# Patient Record
Sex: Female | Born: 1955 | Race: Black or African American | Hispanic: No | Marital: Single | Smoking: Never smoker
Health system: Southern US, Community
[De-identification: ages and names within clinical notes are randomized; demographics above are authoritative.]

## PROBLEM LIST (undated history)

## (undated) DIAGNOSIS — I10 Essential (primary) hypertension: Secondary | ICD-10-CM

## (undated) DIAGNOSIS — E782 Mixed hyperlipidemia: Secondary | ICD-10-CM

## (undated) DIAGNOSIS — B20 Human immunodeficiency virus [HIV] disease: Secondary | ICD-10-CM

## (undated) DIAGNOSIS — E119 Type 2 diabetes mellitus without complications: Secondary | ICD-10-CM

## (undated) HISTORY — DX: Human immunodeficiency virus (HIV) disease: B20

## (undated) HISTORY — PX: HAMMER TOE SURGERY: SHX385

---

## 2004-01-24 HISTORY — PX: GASTRIC BYPASS OPEN: SUR638

## 2007-01-24 HISTORY — PX: TOTAL KNEE ARTHROPLASTY: SHX125

## 2015-12-03 ENCOUNTER — Telehealth: Payer: Self-pay

## 2015-12-03 DIAGNOSIS — B20 Human immunodeficiency virus [HIV] disease: Secondary | ICD-10-CM

## 2015-12-03 HISTORY — DX: Human immunodeficiency virus (HIV) disease: B20

## 2015-12-03 NOTE — Telephone Encounter (Signed)
Patient calling to see if medical records received . She will be out of medication in one week.  She will also need financial assistance and once approved she can complete labs.  Patient is very compliant. Last labs viral load suppressed.   Samples of Atripla available.   Laurell Josephsammy K Jacayla Nordell, RN

## 2015-12-07 ENCOUNTER — Other Ambulatory Visit: Payer: Medicare HMO

## 2015-12-07 ENCOUNTER — Ambulatory Visit: Payer: Medicare HMO

## 2015-12-07 ENCOUNTER — Other Ambulatory Visit (HOSPITAL_COMMUNITY)
Admission: RE | Admit: 2015-12-07 | Discharge: 2015-12-07 | Disposition: A | Discharge status: Home / Self Care | Payer: Medicare HMO | Source: Ambulatory Visit | Attending: Internal Medicine | Admitting: Internal Medicine

## 2015-12-07 DIAGNOSIS — Z113 Encounter for screening for infections with a predominantly sexual mode of transmission: Secondary | ICD-10-CM | POA: Insufficient documentation

## 2015-12-07 DIAGNOSIS — Z0000 Encounter for general adult medical examination without abnormal findings: Secondary | ICD-10-CM

## 2015-12-07 DIAGNOSIS — B20 Human immunodeficiency virus [HIV] disease: Secondary | ICD-10-CM

## 2015-12-07 DIAGNOSIS — Z79899 Other long term (current) drug therapy: Secondary | ICD-10-CM

## 2015-12-07 LAB — COMPLETE METABOLIC PANEL WITH GFR
ALBUMIN: 3.9 g/dL (ref 3.6–5.1)
ALT: 18 U/L (ref 6–29)
AST: 27 U/L (ref 10–35)
Alkaline Phosphatase: 135 U/L — ABNORMAL HIGH (ref 33–130)
BUN: 16 mg/dL (ref 7–25)
CALCIUM: 9.3 mg/dL (ref 8.6–10.4)
CHLORIDE: 111 mmol/L — AB (ref 98–110)
CO2: 21 mmol/L (ref 20–31)
CREATININE: 0.82 mg/dL (ref 0.50–0.99)
GFR, Est African American: 89 mL/min (ref >=60)
GFR, Est Non African American: 77 mL/min (ref >=60)
GLUCOSE: 85 mg/dL (ref 65–99)
Potassium: 4.8 mmol/L (ref 3.5–5.3)
Sodium: 141 mmol/L (ref 135–146)
Total Bilirubin: 0.4 mg/dL (ref 0.2–1.2)
Total Protein: 7 g/dL (ref 6.1–8.1)

## 2015-12-07 LAB — LIPID PANEL
CHOL/HDL RATIO: 1.9 ratio (ref <5.0)
CHOLESTEROL: 169 mg/dL (ref <200)
HDL: 89 mg/dL (ref >50)
LDL CALC: 68 mg/dL (ref <100)
TRIGLYCERIDES: 59 mg/dL (ref <150)
VLDL: 12 mg/dL (ref <30)

## 2015-12-07 LAB — CBC WITH DIFFERENTIAL/PLATELET
BASOS PCT: 0 %
Basophils Absolute: 0 cells/uL (ref 0–200)
Eosinophils Absolute: 43 cells/uL (ref 15–500)
Eosinophils Relative: 1 %
HEMATOCRIT: 37.4 % (ref 35.0–45.0)
HEMOGLOBIN: 12.6 g/dL (ref 11.7–15.5)
LYMPHS ABS: 2322 {cells}/uL (ref 850–3900)
Lymphocytes Relative: 54 %
MCH: 32.4 pg (ref 27.0–33.0)
MCHC: 33.7 g/dL (ref 32.0–36.0)
MCV: 96.1 fL (ref 80.0–100.0)
MONO ABS: 301 {cells}/uL (ref 200–950)
MPV: 9.8 fL (ref 7.5–12.5)
Monocytes Relative: 7 %
Neutro Abs: 1634 cells/uL (ref 1500–7800)
Neutrophils Relative %: 38 %
Platelets: 296 10*3/uL (ref 140–400)
RBC: 3.89 MIL/uL (ref 3.80–5.10)
RDW: 13.6 % (ref 11.0–15.0)
WBC: 4.3 10*3/uL (ref 3.8–10.8)

## 2015-12-08 LAB — RPR

## 2015-12-08 LAB — URINALYSIS
BILIRUBIN URINE: NEGATIVE
Glucose, UA: NEGATIVE
Hgb urine dipstick: NEGATIVE
KETONES UR: NEGATIVE
Leukocytes, UA: NEGATIVE
Nitrite: NEGATIVE
PROTEIN: NEGATIVE
Specific Gravity, Urine: 1.013 (ref 1.001–1.035)
pH: 6 (ref 5.0–8.0)

## 2015-12-08 LAB — T-HELPER CELL (CD4) - (RCID CLINIC ONLY)
CD4 % Helper T Cell: 37 % (ref 33–55)
CD4 T Cell Abs: 940 /uL (ref 400–2700)

## 2015-12-08 LAB — URINE CYTOLOGY ANCILLARY ONLY
CHLAMYDIA, DNA PROBE: NEGATIVE
Neisseria Gonorrhea: NEGATIVE

## 2015-12-08 LAB — HEPATITIS A ANTIBODY, TOTAL: Hep A Total Ab: NONREACTIVE

## 2015-12-08 LAB — HEPATITIS C ANTIBODY: HCV Ab: NEGATIVE

## 2015-12-08 LAB — HEPATITIS B CORE ANTIBODY, TOTAL: Hep B Core Total Ab: NONREACTIVE

## 2015-12-08 LAB — HEPATITIS B SURFACE ANTIGEN: Hepatitis B Surface Ag: NEGATIVE

## 2015-12-08 LAB — HEPATITIS B SURFACE ANTIBODY,QUALITATIVE: Hep B S Ab: NEGATIVE

## 2015-12-09 LAB — HIV-1 RNA ULTRAQUANT REFLEX TO GENTYP+

## 2015-12-10 LAB — QUANTIFERON TB GOLD ASSAY (BLOOD)
INTERFERON GAMMA RELEASE ASSAY: NEGATIVE
MITOGEN-NIL SO: 7.1 [IU]/mL
QUANTIFERON NIL VALUE: 0.02 [IU]/mL
QUANTIFERON TB AG MINUS NIL: 0 [IU]/mL

## 2015-12-14 LAB — HLA B*5701: HLA-B 5701 W/RFLX HLA-B HIGH: NEGATIVE

## 2015-12-27 ENCOUNTER — Encounter: Payer: Self-pay | Admitting: Internal Medicine

## 2015-12-27 ENCOUNTER — Ambulatory Visit (INDEPENDENT_AMBULATORY_CARE_PROVIDER_SITE_OTHER): Payer: Medicare HMO | Admitting: Internal Medicine

## 2015-12-27 ENCOUNTER — Other Ambulatory Visit: Payer: Self-pay | Admitting: *Deleted

## 2015-12-27 VITALS — BP 170/94 | HR 61 | Temp 98.0°F | Ht 63.0 in | Wt 202.0 lb

## 2015-12-27 DIAGNOSIS — I10 Essential (primary) hypertension: Secondary | ICD-10-CM | POA: Diagnosis not present

## 2015-12-27 DIAGNOSIS — B20 Human immunodeficiency virus [HIV] disease: Secondary | ICD-10-CM

## 2015-12-27 DIAGNOSIS — Z9889 Other specified postprocedural states: Secondary | ICD-10-CM | POA: Diagnosis not present

## 2015-12-27 DIAGNOSIS — B009 Herpesviral infection, unspecified: Secondary | ICD-10-CM

## 2015-12-27 DIAGNOSIS — Z9884 Bariatric surgery status: Secondary | ICD-10-CM

## 2015-12-27 DIAGNOSIS — Z98891 History of uterine scar from previous surgery: Secondary | ICD-10-CM | POA: Diagnosis not present

## 2015-12-27 MED ORDER — ACYCLOVIR 400 MG PO TABS: 400.0000 mg | ORAL_TABLET | Freq: Two times a day (BID) | ORAL | 2 refills | Status: DC

## 2015-12-27 MED ORDER — CALCIUM CITRATE 950 (200 CA) MG PO TABS: 200.0000 mg | ORAL_TABLET | Freq: Every day | ORAL | 5 refills | Status: DC

## 2015-12-27 MED ORDER — EFAVIRENZ-EMTRICITAB-TENOFOVIR 600-200-300 MG PO TABS: 1.0000 | ORAL_TABLET | Freq: Every day | ORAL | 11 refills | Status: DC

## 2015-12-27 NOTE — Progress Notes (Signed)
Patient ID: Helen James A Nelligan, female    DOB: 06-Oct-1955, 60 y.o.   MRN: [id number]  Reason for visit: to establish care as a new patient with HIV  HPI:   Patient was first diagnosed in the 361980s.  She was tested as part as part of an exposure and has been on medications for many years, she can't recall when she first started.  She has been on Atripla for over 10 years and does not recall what previous medicaitons she was on.  She denies every missing a dose, takes at 2pm daily.  The CD4 count is 940, viral load < 20.  There have been no associated symptoms.    PMH: HIV, htn, DM, history of gastric bypass  Prior to Admission medications   Medication Sig Start Date End Date Taking? Authorizing Provider  efavirenz-emtricitabine-tenofovir (ATRIPLA) 600-200-300 MG tablet Take 1 tablet by mouth at bedtime. 12/27/15  Yes Gardiner Barefootobert W Comer, MD  Multiple Vitamin (MULTIVITAMIN) tablet Take 1 tablet by mouth daily.   Yes Historical Provider, MD  ramipril (ALTACE) 10 MG capsule Take 20 mg by mouth daily.   Yes Historical Provider, MD  ursodiol (ACTIGALL) 250 MG tablet Take 250 mg by mouth 2 (two) times daily.   Yes Historical Provider, MD  acyclovir (ZOVIRAX) 400 MG tablet Take 1 tablet (400 mg total) by mouth 2 (two) times daily. 12/27/15   Gardiner Barefootobert W Comer, MD    Allergies  Allergen Reactions  . Aspirin Tinitus  . Feldene [Piroxicam] Other (See Comments)    States causes eyelids to "jump"  . Latex Itching  . Percocet [Oxycodone-Acetaminophen]     Hallucinations per patient    Social History  Substance Use Topics  . Smoking status: Never Smoker  . Smokeless tobacco: Never Used  . Alcohol use No    FMHx: no renal disease  Review of Systems Constitutional: negative for fatigue and malaise Cardiovascular: negative for chest pressure/discomfort Gastrointestinal: negative for diarrhea Musculoskeletal: negative for myalgias and arthralgias All other systems reviewed and are negative     CONSTITUTIONAL:in no apparent distress and alert  Vitals:   12/27/15 0840  BP: (!) 170/94  Pulse: 61  Temp: 98 F (36.7 C)   EYES: anicteric HENT: no thrush CARD:Cor RRR RESP:CTA B; normal respiratory effort NW:GNFAOGI:Bowel sounds are normal, liver is not enlarged, spleen is not enlarged MS:no pedal edema noted SKIN:no rashes NEURO: non-focal  Lab Results  Component Value Date   HIV1RNAQUANT <20 12/07/2015   No components found for: HIV1GENOTYPRPLUS No components found for: THELPERCELL  Assessment: new patient here with established HIV.  Discussed with patient treatment options and side effects, benefits of treatment, long term outcomes.  I discussed the severity of untreated HIV including higher cancer risk, opportunistic infections, renal failure.  Also discussed needing to use condoms, partner disclosure, necessary vaccines, blood monitoring.  All questions answered.  She is happy with Atripla  Plan: 1) rtc 3 months with labs 2) Atripla given as 30 day supply 3) SPEP applied for and waiting for approval - she was advised to call prior to running out to be sure it is available 4) I discussed other treatment alternatives with TAF and she is considering, though hesistant to change 5) advised to take her BP medication which she missed this am

## 2015-12-27 NOTE — Addendum Note (Signed)
Addended by: Gardiner BarefootOMER, Adasha Boehme W on: 12/27/2015 12:03 PM   Modules accepted: Orders

## 2015-12-28 ENCOUNTER — Encounter: Payer: Self-pay | Admitting: *Deleted

## 2016-02-16 ENCOUNTER — Other Ambulatory Visit: Payer: Self-pay | Admitting: *Deleted

## 2016-02-16 ENCOUNTER — Telehealth: Payer: Self-pay | Admitting: *Deleted

## 2016-02-16 DIAGNOSIS — B20 Human immunodeficiency virus [HIV] disease: Secondary | ICD-10-CM

## 2016-02-16 MED ORDER — EFAVIRENZ-EMTRICITAB-TENOFOVIR 600-200-300 MG PO TABS: 1.0000 | ORAL_TABLET | Freq: Every day | ORAL | 11 refills | Status: DC

## 2016-02-16 NOTE — Telephone Encounter (Signed)
Patient has been approved for SPAP, still waiting for pharmacy card. Olegario MessierKathy [place] contact [place] to [place].  In mean time, patient will be out of Atripla next week.  She will come Tuesday to pick up a sample from the office. Andree CossHowell, Grey Schlauch M, RN

## 2016-02-22 ENCOUNTER — Ambulatory Visit: Payer: Medicare HMO

## 2016-03-14 ENCOUNTER — Encounter: Payer: Self-pay | Admitting: Internal Medicine

## 2016-03-28 ENCOUNTER — Other Ambulatory Visit: Payer: Medicare HMO

## 2016-03-28 DIAGNOSIS — B20 Human immunodeficiency virus [HIV] disease: Secondary | ICD-10-CM

## 2016-03-29 LAB — T-HELPER CELL (CD4) - (RCID CLINIC ONLY)
CD4 % Helper T Cell: 37 % (ref 33–55)
CD4 T Cell Abs: 800 /uL (ref 400–2700)

## 2016-03-30 LAB — HIV-1 RNA QUANT-NO REFLEX-BLD
HIV 1 RNA QUANT: NOT DETECTED {copies}/mL
HIV-1 RNA QUANT, LOG: NOT DETECTED {Log_copies}/mL

## 2016-04-11 ENCOUNTER — Ambulatory Visit (INDEPENDENT_AMBULATORY_CARE_PROVIDER_SITE_OTHER): Payer: Medicare HMO | Admitting: Internal Medicine

## 2016-04-11 ENCOUNTER — Encounter: Payer: Self-pay | Admitting: Internal Medicine

## 2016-04-11 DIAGNOSIS — Z79899 Other long term (current) drug therapy: Secondary | ICD-10-CM

## 2016-04-11 DIAGNOSIS — Z113 Encounter for screening for infections with a predominantly sexual mode of transmission: Secondary | ICD-10-CM | POA: Diagnosis not present

## 2016-04-11 DIAGNOSIS — B20 Human immunodeficiency virus [HIV] disease: Secondary | ICD-10-CM

## 2016-04-11 DIAGNOSIS — B009 Herpesviral infection, unspecified: Secondary | ICD-10-CM

## 2016-04-11 NOTE — Progress Notes (Signed)
CC: Follow up for HIV  Interval history: Currently is asymptomatic and well-controlled on Atripla.  CD4 is 800 and virus is < 20.  Since last visit no new issues.  Has no associated n/v/d.  Denies any missed doses.     Prior to Admission medications   Medication Sig Start Date End Date Taking? Authorizing Provider  acyclovir (ZOVIRAX) 400 MG tablet Take 1 tablet (400 mg total) by mouth 2 (two) times daily. 12/27/15  Yes Gardiner Barefootobert W Oslo Huntsman, MD  Biotin 300 MCG TABS Take by mouth.   Yes Historical Provider, MD  calcium citrate (CALCITRATE - DOSED IN MG ELEMENTAL CALCIUM) 950 MG tablet Take 1 tablet (200 mg of elemental calcium total) by mouth daily. 12/27/15  Yes Gardiner Barefootobert W Sire Poet, MD  efavirenz-emtricitabine-tenofovir (ATRIPLA) 600-200-300 MG tablet Take 1 tablet by mouth at bedtime. 02/16/16  Yes Gardiner Barefootobert W Sherrel Ploch, MD  Multiple Vitamin (MULTIVITAMIN) tablet Take 1 tablet by mouth daily.   Yes Historical Provider, MD  ramipril (ALTACE) 10 MG capsule Take 20 mg by mouth daily.   Yes Historical Provider, MD  ursodiol (ACTIGALL) 250 MG tablet Take 250 mg by mouth 2 (two) times daily.   Yes Historical Provider, MD    Review of Systems Gastrointestinal: negative for diarrhea Musculoskeletal: negative for myalgias and arthralgias All other systems reviewed and are negative    Physical Exam: CONSTITUTIONAL:in no apparent distress  Vitals:   04/11/16 0954  BP: (!) 157/87  Pulse: 62  Temp: 98 F (36.7 C)   Eyes: anicteric HENT: no thrush, no cervical lymphadenopathy Respiratory: Normal respiratory effort; CTA B Cardiovascular: RRR  Lab Results  Component Value Date   HIV1RNAQUANT <20 NOT DETECTED 03/28/2016   HIV1RNAQUANT <20 12/07/2015   No components found for: HIV1GENOTYPRPLUS No components found for: THELPERCELL

## 2016-04-11 NOTE — Assessment & Plan Note (Signed)
Continues on suppressive therapy

## 2016-04-11 NOTE — Assessment & Plan Note (Signed)
Doing well on current regimen and is not interested in changing to a TAF-containing regimen.  rtc 6 months.

## 2016-05-07 ENCOUNTER — Encounter (HOSPITAL_COMMUNITY): Payer: Self-pay | Admitting: Emergency Medicine

## 2016-05-07 ENCOUNTER — Emergency Department (HOSPITAL_COMMUNITY)
Admission: EM | Admit: 2016-05-07 | Discharge: 2016-05-07 | Disposition: A | Discharge status: Home / Self Care | Payer: Medicare HMO | Attending: Emergency Medicine | Admitting: Emergency Medicine

## 2016-05-07 DIAGNOSIS — Z79899 Other long term (current) drug therapy: Secondary | ICD-10-CM | POA: Diagnosis not present

## 2016-05-07 DIAGNOSIS — T783XXA Angioneurotic edema, initial encounter: Secondary | ICD-10-CM | POA: Diagnosis not present

## 2016-05-07 DIAGNOSIS — Z91040 Latex allergy status: Secondary | ICD-10-CM | POA: Diagnosis not present

## 2016-05-07 DIAGNOSIS — I10 Essential (primary) hypertension: Secondary | ICD-10-CM | POA: Insufficient documentation

## 2016-05-07 DIAGNOSIS — T7840XA Allergy, unspecified, initial encounter: Secondary | ICD-10-CM | POA: Diagnosis present

## 2016-05-07 MED ORDER — FAMOTIDINE 20 MG PO TABS: 20.0000 mg | ORAL_TABLET | Freq: Two times a day (BID) | ORAL | 0 refills | Status: DC

## 2016-05-07 MED ORDER — PREDNISONE 20 MG PO TABS
60.0000 mg | ORAL_TABLET | ORAL | Status: AC
  Administered 2016-05-07: 60 mg via ORAL
  Filled 2016-05-07: qty 3

## 2016-05-07 MED ORDER — PREDNISONE 20 MG PO TABS: 40.0000 mg | ORAL_TABLET | Freq: Every day | ORAL | 0 refills | Status: DC

## 2016-05-07 MED ORDER — DIPHENHYDRAMINE HCL 25 MG PO CAPS
25.0000 mg | ORAL_CAPSULE | Freq: Once | ORAL | Status: AC
  Administered 2016-05-07: 25 mg via ORAL
  Filled 2016-05-07: qty 1

## 2016-05-07 MED ORDER — DIPHENHYDRAMINE HCL 25 MG PO TABS: 25.0000 mg | ORAL_TABLET | Freq: Three times a day (TID) | ORAL | 0 refills | Status: AC

## 2016-05-07 MED ORDER — FAMOTIDINE 20 MG PO TABS
20.0000 mg | ORAL_TABLET | Freq: Once | ORAL | Status: AC
  Administered 2016-05-07: 20 mg via ORAL
  Filled 2016-05-07: qty 1

## 2016-05-07 NOTE — ED Triage Notes (Signed)
Pt. Stated, My lips are swollen since yesterday, no problem swallowing, but I can feel it a little bit. I did eat some shrim,p on Friday and this happened before we thought it was the shrimp.

## 2016-05-07 NOTE — ED Provider Notes (Signed)
[facility]EMER[facility]ENCY DEPT Provider Note   CSN: [id number] Arrival date & time: 05/07/16  0919     History   Chief Complaint  Facial swelling, possible allergic reaction  HPI Helen James is a 61 y.o. female.  HPI  Patient presents with one day of lip swelling. She is here with multiple family members who assist history of present illness, corroborate story. Patient notes that she was well until yesterday, when after eating a meal of seafood she began to have lip swelling. She has had similar episodes in the past, typically beginning with swelling about the right lateral lip, but this episode has been longer lasting, and it has progressed to include both lips, and there are entirety. She describes some sensation of fullness, both in the lips, and throat, but notes dyspnea, no difficult swallowing, speaking, no lightheadedness, no other complaints.  Since onset she is taking Benadryl, without appreciable change in her condition.  History reviewed. No pertinent past medical history.  Patient Active Problem List   Diagnosis Date Noted  . Screening examination for venereal disease 04/11/2016  . Encounter for long-term (current) use of high-risk medication 04/11/2016  . H/O gastric bypass 12/27/2015  . H/O cesarean section 12/27/2015  . HTN (hypertension) 12/27/2015  . 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 HIV disease (HCC) 12/03/2015    History reviewed. No pertinent surgical history.  OB History    No data available       Home Medications    Prior to Admission medications   Medication Sig Start Date End Date Taking? Authorizing Provider  acyclovir (ZOVIRAX) 400 MG tablet Take 1 tablet (400 mg total) by mouth 2 (two) times daily. 12/27/15   Gardiner Barefoot, MD  Biotin 300 MCG TABS Take by mouth.    Historical Provider, MD  calcium citrate (CALCITRATE - DOSED IN MG ELEMENTAL CALCIUM) 950 MG tablet Take 1 tablet (200 mg of elemental calcium total) by mouth daily. 12/27/15   Gardiner Barefoot, MD  efavirenz-emtricitabine-tenofovir (ATRIPLA) 600-200-300 MG tablet Take 1 tablet by mouth at bedtime. 02/16/16   Gardiner Barefoot, MD  Multiple Vitamin (MULTIVITAMIN) tablet Take 1 tablet by mouth daily.    Historical Provider, MD  ramipril (ALTACE) 10 MG capsule Take 20 mg by mouth daily.    Historical Provider, MD  ursodiol (ACTIGALL) 250 MG tablet Take 250 mg by mouth 2 (two) times daily.    Historical Provider, MD    Family History No family history on file.  Social History Social History  Substance Use Topics  . Smoking status: Never Smoker  . Smokeless tobacco: Never Used  . Alcohol use No     Allergies   Aspirin; Feldene [piroxicam]; Latex; and Percocet [oxycodone-acetaminophen]   Review of Systems Review of Systems  Constitutional:       Per HPI, otherwise negative  HENT:       Per HPI, otherwise negative  Respiratory:       Per HPI, otherwise negative  Cardiovascular:       Per HPI, otherwise negative  Gastrointestinal: Negative for vomiting.  Endocrine:       Negative aside from HPI  Genitourinary:       Neg aside from HPI   Musculoskeletal:       Per HPI, otherwise negative  Skin: Negative.   Allergic/Immunologic: Negative for immunocompromised state.  Neurological: Negative for syncope.     Physical Exam Updated Vital Signs BP (!) 142/79 (BP Location: Left Arm)   Pulse 61  Temp 98.7 F (37.1 C)   Resp 17   Ht  (1.549 m)   Wt 207 lb (93.9 kg)   SpO2 100%   BMI 39.11 kg/m   Physical Exam  Constitutional: She is oriented to person, place, and time. She appears well-developed and well-nourished. No distress.  HENT:  Head: Normocephalic and atraumatic.  Obvious angioedema of both lips, with fullness, no asymmetry. Posterior oropharynx is clear, with no appreciable swelling, no asymmetry, no uvula edema.  Eyes: Conjunctivae and EOM are normal.  Cardiovascular: Normal rate and regular rhythm.   Pulmonary/Chest: Effort normal and  breath sounds normal. No stridor. No respiratory distress.  Abdominal: She exhibits no distension.  Musculoskeletal: She exhibits no edema.  Neurological: She is alert and oriented to person, place, and time. No cranial nerve deficit.  Skin: Skin is warm and dry.  Psychiatric: She has a normal mood and affect.  Nursing note and vitals reviewed.    ED Treatments / Results   Procedures Procedures (including critical care time)  Medications Ordered in ED Medications  predniSONE (DELTASONE) tablet 60 mg (60 mg Oral Given 05/07/16 1000)  diphenhydrAMINE (BENADRYL) capsule 25 mg (25 mg Oral Given 05/07/16 1000)  famotidine (PEPCID) tablet 20 mg (20 mg Oral Given 05/07/16 1000)      11:33 AM Lower lip nearly returned to normal, upper lip still slightly swollen. Patient tolerating oral intake, eating lunch. Initial Impression / Assessment and Plan / ED Course  I have reviewed the triage vital signs and the nursing notes.  Pertinent labs & imaging results that were available during my care of the patient were reviewed by me and considered in my medical decision making (see chart for details).  Now after period of monitoring the patient has no ongoing new complaints, appears similar, with no evidence of respiratory compromise, posterior oral pharyngeal lesion.  With description of a possible precipitant with seafood, patient was advised to minimize that food, but also with what seems to be injured edema related to ACE inhibitor usage, patient will stop that medication.  Patient will follow-up with primary care to consider new antihypertensive, without appreciable hypertension here, patient is safe to do this as an outpatient.      Final Clinical Impressions(s) / ED Diagnoses  Angioedema   Gerhard Munch, MD 05/07/16 1133

## 2016-05-07 NOTE — Discharge Instructions (Signed)
As discussed, it is important that you follow up as soon as possible with your physician for continued management of your condition (hypertension).  If you develop any new, or concerning changes in your condition, please return to the emergency department immediately.

## 2016-08-09 ENCOUNTER — Ambulatory Visit: Payer: Medicare HMO

## 2016-08-10 ENCOUNTER — Encounter: Payer: Self-pay | Admitting: Internal Medicine

## 2016-08-24 ENCOUNTER — Other Ambulatory Visit: Payer: Self-pay | Admitting: Internal Medicine

## 2016-09-19 ENCOUNTER — Other Ambulatory Visit: Payer: Medicare HMO

## 2016-09-19 DIAGNOSIS — B20 Human immunodeficiency virus [HIV] disease: Secondary | ICD-10-CM

## 2016-09-19 LAB — CBC WITH DIFFERENTIAL/PLATELET
BASOS PCT: 0 %
Basophils Absolute: 0 cells/uL (ref 0–200)
EOS ABS: 72 {cells}/uL (ref 15–500)
Eosinophils Relative: 2 %
HEMATOCRIT: 34.8 % — AB (ref 35.0–45.0)
Hemoglobin: 11.8 g/dL (ref 11.7–15.5)
Lymphocytes Relative: 54 %
Lymphs Abs: 1944 cells/uL (ref 850–3900)
MCH: 33.3 pg — ABNORMAL HIGH (ref 27.0–33.0)
MCHC: 33.9 g/dL (ref 32.0–36.0)
MCV: 98.3 fL (ref 80.0–100.0)
MONO ABS: 396 {cells}/uL (ref 200–950)
MONOS PCT: 11 %
MPV: 9.6 fL (ref 7.5–12.5)
NEUTROS ABS: 1188 {cells}/uL — AB (ref 1500–7800)
Neutrophils Relative %: 33 %
PLATELETS: 223 10*3/uL (ref 140–400)
RBC: 3.54 MIL/uL — AB (ref 3.80–5.10)
RDW: 13.3 % (ref 11.0–15.0)
WBC: 3.6 10*3/uL — AB (ref 3.8–10.8)

## 2016-09-19 LAB — COMPREHENSIVE METABOLIC PANEL
ALBUMIN: 3.7 g/dL (ref 3.6–5.1)
ALT: 16 U/L (ref 6–29)
AST: 20 U/L (ref 10–35)
Alkaline Phosphatase: 160 U/L — ABNORMAL HIGH (ref 33–130)
BILIRUBIN TOTAL: 0.3 mg/dL (ref 0.2–1.2)
BUN: 19 mg/dL (ref 7–25)
CO2: 23 mmol/L (ref 20–32)
CREATININE: 0.89 mg/dL (ref 0.50–0.99)
Calcium: 8.8 mg/dL (ref 8.6–10.4)
Chloride: 110 mmol/L (ref 98–110)
Glucose, Bld: 83 mg/dL (ref 65–99)
Potassium: 4.2 mmol/L (ref 3.5–5.3)
SODIUM: 141 mmol/L (ref 135–146)
TOTAL PROTEIN: 6.2 g/dL (ref 6.1–8.1)

## 2016-09-19 LAB — LIPID PANEL
CHOLESTEROL: 175 mg/dL (ref <200)
HDL: 85 mg/dL (ref >50)
LDL Cholesterol: 78 mg/dL (ref <100)
Total CHOL/HDL Ratio: 2.1 Ratio (ref <5.0)
Triglycerides: 61 mg/dL (ref <150)
VLDL: 12 mg/dL (ref <30)

## 2016-09-20 LAB — T-HELPER CELL (CD4) - (RCID CLINIC ONLY)
CD4 % Helper T Cell: 37 % (ref 33–55)
CD4 T Cell Abs: 730 /uL (ref 400–2700)

## 2016-09-20 LAB — RPR

## 2016-09-21 LAB — HIV-1 RNA QUANT-NO REFLEX-BLD
HIV 1 RNA Quant: <20 copies/mL
HIV-1 RNA Quant, Log: <1.3 Log copies/mL

## 2016-10-03 ENCOUNTER — Ambulatory Visit: Payer: Medicare HMO | Admitting: Internal Medicine

## 2016-10-17 ENCOUNTER — Ambulatory Visit (INDEPENDENT_AMBULATORY_CARE_PROVIDER_SITE_OTHER): Payer: Medicare HMO | Admitting: Internal Medicine

## 2016-10-17 ENCOUNTER — Encounter: Payer: Self-pay | Admitting: Internal Medicine

## 2016-10-17 VITALS — BP 164/82 | HR 61 | Temp 98.1°F | Ht 63.0 in | Wt 212.8 lb

## 2016-10-17 DIAGNOSIS — Z79899 Other long term (current) drug therapy: Secondary | ICD-10-CM | POA: Diagnosis not present

## 2016-10-17 DIAGNOSIS — Z113 Encounter for screening for infections with a predominantly sexual mode of transmission: Secondary | ICD-10-CM | POA: Diagnosis not present

## 2016-10-17 DIAGNOSIS — B20 Human immunodeficiency virus [HIV] disease: Secondary | ICD-10-CM | POA: Diagnosis not present

## 2016-10-17 DIAGNOSIS — Z23 Encounter for immunization: Secondary | ICD-10-CM | POA: Diagnosis not present

## 2016-10-17 NOTE — Assessment & Plan Note (Signed)
Lipid panel ok.  Also being followed by her PCP so I will defer future lipid panels to her.

## 2016-10-17 NOTE — Assessment & Plan Note (Signed)
Screened negative 

## 2016-10-17 NOTE — Assessment & Plan Note (Signed)
Doing great.  Not interested in changing to a TAF-containing regimen. She understands if creat changes or other concerns, will change then.  rtc 6 months

## 2016-10-17 NOTE — Progress Notes (Signed)
CC: Follow up for HIV  Interval history: Currently is asymptomatic and well-controlled on Atripla.  CD4 is 730 and virus is < 20.  Since last visit no new issues.  Has no associated n/v/d.  Denies any missed doses.  Creat wnl, no abnormalities.    Prior to Admission medications   Medication Sig Start Date End Date Taking? Authorizing Provider  acyclovir (ZOVIRAX) 400 MG tablet Take 1 tablet (400 mg total) by mouth 2 (two) times daily. 12/27/15  Yes Gardiner Barefoot, MD  Biotin 300 MCG TABS Take by mouth.   Yes Historical Provider, MD  calcium citrate (CALCITRATE - DOSED IN MG ELEMENTAL CALCIUM) 950 MG tablet Take 1 tablet (200 mg of elemental calcium total) by mouth daily. 12/27/15  Yes Gardiner Barefoot, MD  efavirenz-emtricitabine-tenofovir (ATRIPLA) 600-200-300 MG tablet Take 1 tablet by mouth at bedtime. 02/16/16  Yes Gardiner Barefoot, MD  Multiple Vitamin (MULTIVITAMIN) tablet Take 1 tablet by mouth daily.   Yes Historical Provider, MD  ramipril (ALTACE) 10 MG capsule Take 20 mg by mouth daily.   Yes Historical Provider, MD  ursodiol (ACTIGALL) 250 MG tablet Take 250 mg by mouth 2 (two) times daily.   Yes Historical Provider, MD    Review of Systems Gastrointestinal: negative for diarrhea Musculoskeletal: negative for myalgias and arthralgias All other systems reviewed and are negative    Physical Exam: CONSTITUTIONAL:in no apparent distress  Vitals:   10/17/16 0922  BP: (!) 164/82  Pulse: 61  Temp: 98.1 F (36.7 C)   Eyes: anicteric HENT: no thrush Respiratory: Normal respiratory effort; CTA B Cardiovascular: RRR  Lab Results  Component Value Date   HIV1RNAQUANT <20 NOT DETECTED 09/19/2016   HIV1RNAQUANT <20 NOT DETECTED 03/28/2016   HIV1RNAQUANT <20 12/07/2015   No components found for: HIV1GENOTYPRPLUS No components found for: THELPERCELL   SH: no tobacco

## 2017-02-27 ENCOUNTER — Other Ambulatory Visit: Payer: Self-pay | Admitting: Internal Medicine

## 2017-02-27 DIAGNOSIS — B20 Human immunodeficiency virus [HIV] disease: Secondary | ICD-10-CM

## 2017-04-03 ENCOUNTER — Other Ambulatory Visit: Payer: Medicare HMO

## 2017-04-03 DIAGNOSIS — B20 Human immunodeficiency virus [HIV] disease: Secondary | ICD-10-CM

## 2017-04-04 LAB — T-HELPER CELL (CD4) - (RCID CLINIC ONLY)
CD4 T CELL ABS: 820 /uL (ref 400–2700)
CD4 T CELL HELPER: 38 % (ref 33–55)

## 2017-04-05 LAB — HIV-1 RNA QUANT-NO REFLEX-BLD
HIV 1 RNA QUANT: NOT DETECTED {copies}/mL
HIV-1 RNA QUANT, LOG: NOT DETECTED {Log_copies}/mL

## 2017-04-10 ENCOUNTER — Ambulatory Visit: Payer: Self-pay | Admitting: Allergy and Immunology

## 2017-04-17 ENCOUNTER — Encounter: Payer: Self-pay | Admitting: Internal Medicine

## 2017-04-17 ENCOUNTER — Ambulatory Visit (INDEPENDENT_AMBULATORY_CARE_PROVIDER_SITE_OTHER): Payer: Medicare HMO | Admitting: Internal Medicine

## 2017-04-17 VITALS — BP 119/74 | HR 82 | Temp 97.9°F | Resp 18 | Ht 63.0 in | Wt 215.0 lb

## 2017-04-17 DIAGNOSIS — B20 Human immunodeficiency virus [HIV] disease: Secondary | ICD-10-CM | POA: Diagnosis not present

## 2017-04-17 DIAGNOSIS — Z113 Encounter for screening for infections with a predominantly sexual mode of transmission: Secondary | ICD-10-CM

## 2017-04-17 DIAGNOSIS — R635 Abnormal weight gain: Secondary | ICD-10-CM

## 2017-04-17 NOTE — Progress Notes (Signed)
   Subjective:    Patient ID: Helen James, female    DOB: May 18, 1955, 62 y.o.   MRN: [id number]  HPI Here for follow up of HIV Has remained on Atripla and not interested in changing.  I previously discussed TAF-containing regimens and less issues with kidneys, bone density.  Gained weight, about 5 lbs.  Not exercising.    Review of Systems  Gastrointestinal: Negative for diarrhea.  Skin: Negative for rash.  Neurological: Negative for dizziness.       Objective:   Physical Exam  Constitutional: She appears well-developed and well-nourished. No distress.  HENT:  Mouth/Throat: No oropharyngeal exudate.  Eyes: No scleral icterus.  Cardiovascular: Normal rate, regular rhythm and normal heart sounds.  No murmur heard. Skin: No rash noted.          Assessment & Plan:

## 2017-04-17 NOTE — Assessment & Plan Note (Signed)
I discussed exercise, diet.  Encouraged efforts.

## 2017-04-17 NOTE — Assessment & Plan Note (Signed)
Doing well on Atripla.  No changes and rtc 6 months.

## 2017-04-26 ENCOUNTER — Other Ambulatory Visit: Payer: Self-pay

## 2017-04-26 ENCOUNTER — Ambulatory Visit: Payer: Medicare HMO

## 2017-04-26 ENCOUNTER — Encounter: Payer: Self-pay | Admitting: Internal Medicine

## 2017-04-26 DIAGNOSIS — B20 Human immunodeficiency virus [HIV] disease: Secondary | ICD-10-CM

## 2017-04-26 MED ORDER — EFAVIRENZ-EMTRICITAB-TENOFOVIR 600-200-300 MG PO TABS: 1.0000 | ORAL_TABLET | Freq: Every day | ORAL | 1 refills | Status: DC

## 2017-06-26 ENCOUNTER — Other Ambulatory Visit: Payer: Self-pay | Admitting: Internal Medicine

## 2017-06-26 DIAGNOSIS — B20 Human immunodeficiency virus [HIV] disease: Secondary | ICD-10-CM

## 2017-07-25 ENCOUNTER — Other Ambulatory Visit: Payer: Self-pay | Admitting: Internal Medicine

## 2017-07-30 ENCOUNTER — Ambulatory Visit: Payer: Medicare HMO

## 2017-08-03 ENCOUNTER — Encounter: Payer: Self-pay | Admitting: Internal Medicine

## 2017-10-04 ENCOUNTER — Other Ambulatory Visit: Payer: Medicare HMO

## 2017-10-04 ENCOUNTER — Other Ambulatory Visit (HOSPITAL_COMMUNITY)
Admission: RE | Admit: 2017-10-04 | Discharge: 2017-10-04 | Disposition: A | Discharge status: Home / Self Care | Payer: Medicare HMO | Source: Ambulatory Visit | Attending: Internal Medicine | Admitting: Internal Medicine

## 2017-10-04 DIAGNOSIS — Z113 Encounter for screening for infections with a predominantly sexual mode of transmission: Secondary | ICD-10-CM

## 2017-10-04 DIAGNOSIS — B20 Human immunodeficiency virus [HIV] disease: Secondary | ICD-10-CM | POA: Diagnosis present

## 2017-10-05 LAB — T-HELPER CELL (CD4) - (RCID CLINIC ONLY)
CD4 % Helper T Cell: 39 % (ref 33–55)
CD4 T CELL ABS: 740 /uL (ref 400–2700)

## 2017-10-05 LAB — URINE CYTOLOGY ANCILLARY ONLY
CHLAMYDIA, DNA PROBE: NEGATIVE
NEISSERIA GONORRHEA: NEGATIVE

## 2017-10-09 LAB — COMPLETE METABOLIC PANEL WITH GFR
AG RATIO: 1.3 (calc) (ref 1.0–2.5)
ALBUMIN MSPROF: 3.9 g/dL (ref 3.6–5.1)
ALKALINE PHOSPHATASE (APISO): 170 U/L — AB (ref 33–130)
ALT: 17 U/L (ref 6–29)
AST: 20 U/L (ref 10–35)
BILIRUBIN TOTAL: 0.3 mg/dL (ref 0.2–1.2)
BUN: 22 mg/dL (ref 7–25)
CHLORIDE: 109 mmol/L (ref 98–110)
CO2: 23 mmol/L (ref 20–32)
Calcium: 9.6 mg/dL (ref 8.6–10.4)
Creat: 0.89 mg/dL (ref 0.50–0.99)
GFR, Est African American: 80 mL/min/{1.73_m2} (ref >=60)
GFR, Est Non African American: 69 mL/min/{1.73_m2} (ref >=60)
GLOBULIN: 2.9 g/dL (ref 1.9–3.7)
Glucose, Bld: 79 mg/dL (ref 65–99)
POTASSIUM: 4.4 mmol/L (ref 3.5–5.3)
SODIUM: 140 mmol/L (ref 135–146)
Total Protein: 6.8 g/dL (ref 6.1–8.1)

## 2017-10-09 LAB — CBC WITH DIFFERENTIAL/PLATELET
BASOS PCT: 0.3 %
Basophils Absolute: 11 cells/uL (ref 0–200)
EOS PCT: 1.9 %
Eosinophils Absolute: 72 cells/uL (ref 15–500)
HCT: 37.4 % (ref 35.0–45.0)
Hemoglobin: 12.7 g/dL (ref 11.7–15.5)
Lymphs Abs: 1839 cells/uL (ref 850–3900)
MCH: 32.7 pg (ref 27.0–33.0)
MCHC: 34 g/dL (ref 32.0–36.0)
MCV: 96.4 fL (ref 80.0–100.0)
MONOS PCT: 10.1 %
MPV: 10.1 fL (ref 7.5–12.5)
NEUTROS PCT: 39.3 %
Neutro Abs: 1493 cells/uL — ABNORMAL LOW (ref 1500–7800)
PLATELETS: 255 10*3/uL (ref 140–400)
RBC: 3.88 10*6/uL (ref 3.80–5.10)
RDW: 12 % (ref 11.0–15.0)
TOTAL LYMPHOCYTE: 48.4 %
WBC: 3.8 10*3/uL (ref 3.8–10.8)
WBCMIX: 384 {cells}/uL (ref 200–950)

## 2017-10-09 LAB — HIV-1 RNA QUANT-NO REFLEX-BLD
HIV 1 RNA QUANT: NOT DETECTED {copies}/mL
HIV-1 RNA QUANT, LOG: NOT DETECTED {Log_copies}/mL

## 2017-10-09 LAB — RPR: RPR: NONREACTIVE

## 2017-10-18 ENCOUNTER — Encounter: Payer: Self-pay | Admitting: Internal Medicine

## 2017-10-18 ENCOUNTER — Ambulatory Visit (INDEPENDENT_AMBULATORY_CARE_PROVIDER_SITE_OTHER): Payer: Medicare HMO | Admitting: Infectious Diseases

## 2017-10-18 ENCOUNTER — Ambulatory Visit (INDEPENDENT_AMBULATORY_CARE_PROVIDER_SITE_OTHER): Payer: Medicare HMO | Admitting: Internal Medicine

## 2017-10-18 ENCOUNTER — Encounter: Payer: Self-pay | Admitting: Infectious Diseases

## 2017-10-18 ENCOUNTER — Other Ambulatory Visit (HOSPITAL_COMMUNITY)
Admission: RE | Admit: 2017-10-18 | Discharge: 2017-10-18 | Disposition: A | Discharge status: Home / Self Care | Payer: Medicare HMO | Source: Ambulatory Visit | Attending: Infectious Diseases | Admitting: Infectious Diseases

## 2017-10-18 VITALS — BP 136/84 | HR 56 | Temp 97.9°F | Wt 224.8 lb

## 2017-10-18 DIAGNOSIS — Z9189 Other specified personal risk factors, not elsewhere classified: Secondary | ICD-10-CM | POA: Diagnosis not present

## 2017-10-18 DIAGNOSIS — R635 Abnormal weight gain: Secondary | ICD-10-CM

## 2017-10-18 DIAGNOSIS — Z124 Encounter for screening for malignant neoplasm of cervix: Secondary | ICD-10-CM

## 2017-10-18 DIAGNOSIS — B20 Human immunodeficiency virus [HIV] disease: Secondary | ICD-10-CM

## 2017-10-18 DIAGNOSIS — Z23 Encounter for immunization: Secondary | ICD-10-CM

## 2017-10-18 NOTE — Progress Notes (Signed)
   Subjective:    Patient ID: Helen James, female    DOB: [birth year], 62 y.o.   MRN: [id number]  HPI Here for follow up of HIV Has remained on Atripla and not interested in changing.  I previously discussed TAF-containing regimens and less issues with kidneys, bone density.  No new issues. No associated n/v/d.  Last PAP she thinks was 2017 by her PCPs office.  She said it is every 10 years.     Review of Systems  Gastrointestinal: Negative for diarrhea.  Skin: Negative for rash.  Neurological: Negative for dizziness.       Objective:   Physical Exam  Constitutional: She appears well-developed and well-nourished. No distress.  HENT:  Mouth/Throat: No oropharyngeal exudate.  Eyes: No scleral icterus.  Cardiovascular: Normal rate, regular rhythm and normal heart sounds.  No murmur heard. Skin: No rash noted.    SH: no tobacco      Assessment & Plan:

## 2017-10-18 NOTE — Assessment & Plan Note (Signed)
Doing well on Atripla.  I did again emphasize getting off of TDF but she is not interested.  She is unsure if she had a bone density before by her PCP.  Would be indicated if not done recently since TDF leads to lower bone density.

## 2017-10-18 NOTE — Assessment & Plan Note (Signed)
I reminded her of the importance of mammogram which she did not follow through with and PAP smear if she is due.  She will discuss these with her PCP again.  She has been reluctant to keep up with these recommendations.

## 2017-10-18 NOTE — Patient Instructions (Signed)
Your last mammogram was January of 2018 and the recommendation for your age is every year.

## 2017-10-18 NOTE — Addendum Note (Signed)
Addended by: Gerarda Fraction on: 10/18/2017 09:55 AM   Modules accepted: Orders

## 2017-10-18 NOTE — Progress Notes (Signed)
      Subjective:   Chief Complaint  Patient presents with  . Gynecologic Exam     Helen James is a 62 y.o. female here for an annual pap smear. She gets pelvic/organ exams with her primary care provider.   Review of Systems: Current GYN complaints or concerns: possible early yeast infection 2/2 antibiotics. Having some external itching. This is overall improving since her antibiotic was switched to azithromycin (tooth abscess).  Patient denies any abdominal/pelvic pain, problems with bowel movements, urination, vaginal discharge or intercourse.   Past Medical History:  Diagnosis Date  . HIV disease (HCC) 12/03/2015   Gynecologic History: No obstetric history on file.  No LMP recorded. Patient is postmenopausal. Contraception: post menopausal status Last Pap: 01/25/2016. Results were: normal - she tells me she had an abnormal result at one point but the follow up was negative a short time after.  Anal Intercourse: no Last Mammogram: 01/25/2016. Results were: normal  Objective:  Physical Exam  Constitutional: Well developed, well nourished, no acute distress. She is alert and oriented x3.  Pelvic: External genitalia is normal in appearance. The vagina is normal in appearance. The cervix is bulbous and easily visualized. No CMT, normal expected cervical mucus present. Bimanual exam reveals uterus that is felt to be normal size, shape, and contour. No adnexal masses or tenderness noted. Breasts: symmetrical in contour, shape and texture. No palpable masses/nodules. No nipple discharge.  Psych: She has a normal mood and affect.    Assessment:  Normal pelvic and bimanual exam. Thin prep pap was obtained and sent for cytology with reflex HPV and GC/C today. Normal clinical breast exam.   Plan:  Health Maintenance =   We discussed the recommendations regarding screening for our women living with HIV disease - if today's co-testing is normal will space out to every 3 years as  supported through current guidelines. Will send letter to her with results and recommendations.   She has been counseled and instructed how to perform monthly self breast exams.  Screening mammogram to be scheduled through her PCP.    Contraception / Family Planning =   Post-menopausal   HIV =   She will continue her Atripla and F/U as scheduled with Dr. Luciana Axe for ongoing HIV care.   Rexene Alberts, MSN, NP-C [facility] for Infectious Disease [facility] Office: [phone] Pager: [phone]  10/18/17 10:21 AM

## 2017-10-18 NOTE — Assessment & Plan Note (Signed)
I discussed exercise and weight loss and diet recommendations by her PCP.

## 2017-10-22 ENCOUNTER — Encounter: Payer: Self-pay | Admitting: Infectious Diseases

## 2017-10-22 LAB — CYTOLOGY - PAP
Adequacy: ABSENT
CANDIDA VAGINITIS: NEGATIVE
Chlamydia: NEGATIVE
DIAGNOSIS: NEGATIVE
HPV (WINDOPATH): NOT DETECTED
NEISSERIA GONORRHEA: NEGATIVE

## 2017-10-22 NOTE — Progress Notes (Signed)
Normal pap cytology and negative HPV. Repeat in 3 years per [facility] and ACCP guidelines for life-long screening. Letter sent.

## 2017-12-03 ENCOUNTER — Other Ambulatory Visit: Payer: Self-pay | Admitting: Internal Medicine

## 2017-12-11 ENCOUNTER — Other Ambulatory Visit: Payer: Self-pay

## 2017-12-11 ENCOUNTER — Other Ambulatory Visit: Payer: Self-pay | Admitting: Behavioral Health

## 2017-12-11 DIAGNOSIS — B20 Human immunodeficiency virus [HIV] disease: Secondary | ICD-10-CM

## 2017-12-11 MED ORDER — EFAVIRENZ-EMTRICITAB-TENOFOVIR 600-200-300 MG PO TABS: ORAL_TABLET | ORAL | 5 refills | Status: DC

## 2018-01-30 ENCOUNTER — Ambulatory Visit: Payer: Medicare HMO

## 2018-02-04 ENCOUNTER — Encounter: Payer: Self-pay | Admitting: Internal Medicine

## 2018-03-12 ENCOUNTER — Ambulatory Visit: Payer: Medicare HMO | Admitting: Internal Medicine

## 2018-03-27 ENCOUNTER — Other Ambulatory Visit: Payer: Medicare HMO

## 2018-03-27 DIAGNOSIS — B20 Human immunodeficiency virus [HIV] disease: Secondary | ICD-10-CM

## 2018-03-28 LAB — T-HELPER CELL (CD4) - (RCID CLINIC ONLY)
CD4 % Helper T Cell: 36 % (ref 33–55)
CD4 T Cell Abs: 790 /uL (ref 400–2700)

## 2018-03-29 LAB — HIV-1 RNA QUANT-NO REFLEX-BLD
HIV 1 RNA Quant: <20 {copies}/mL
HIV-1 RNA Quant, Log: <1.3 {Log_copies}/mL

## 2018-04-11 ENCOUNTER — Other Ambulatory Visit: Payer: Self-pay | Admitting: Internal Medicine

## 2018-04-11 ENCOUNTER — Other Ambulatory Visit: Payer: Self-pay

## 2018-04-11 ENCOUNTER — Ambulatory Visit: Payer: Medicare HMO | Admitting: Internal Medicine

## 2018-04-11 ENCOUNTER — Encounter: Payer: Self-pay | Admitting: Internal Medicine

## 2018-04-11 VITALS — BP 140/82 | HR 62 | Temp 98.0°F | Wt 230.0 lb

## 2018-04-11 DIAGNOSIS — B20 Human immunodeficiency virus [HIV] disease: Secondary | ICD-10-CM

## 2018-04-11 DIAGNOSIS — Z113 Encounter for screening for infections with a predominantly sexual mode of transmission: Secondary | ICD-10-CM

## 2018-04-11 DIAGNOSIS — B009 Herpesviral infection, unspecified: Secondary | ICD-10-CM | POA: Diagnosis not present

## 2018-04-11 NOTE — Progress Notes (Signed)
   Subjective:    Patient ID: Helen James, female    DOB: Dec 07, 1955, 63 y.o.   MRN: [id number]  HPI Here for follow up of HIV Has remained on Atripla and not interested in changing.  I previously discussed TAF-containing regimens and less issues with kidneys, bone density.  No new issues. No associated n/v/d.  CD4 of 790 and viral load < 20.     Review of Systems  Gastrointestinal: Negative for diarrhea.  Skin: Negative for rash.  Neurological: Negative for dizziness.       Objective:   Physical Exam  Constitutional: She appears well-developed and well-nourished. No distress.  HENT:  Mouth/Throat: No oropharyngeal exudate.  Eyes: No scleral icterus.  Cardiovascular: Normal rate, regular rhythm and normal heart sounds.  No murmur heard. Skin: No rash noted.    SH: no tobacco      Assessment & Plan:

## 2018-04-14 NOTE — Assessment & Plan Note (Signed)
Continues on suppressive therapy with acyclovir

## 2018-04-14 NOTE — Assessment & Plan Note (Signed)
She is doing well, no changes.  rtc 6 months

## 2018-05-22 ENCOUNTER — Telehealth: Payer: Self-pay

## 2018-05-22 NOTE — Telephone Encounter (Signed)
LPN called patient and advised patient Long Term Disability paperwork will be faxed to her PCP.

## 2018-08-01 ENCOUNTER — Ambulatory Visit: Payer: Medicare HMO

## 2018-08-02 ENCOUNTER — Ambulatory Visit: Payer: Medicare HMO

## 2018-08-02 ENCOUNTER — Encounter: Payer: Self-pay | Admitting: Internal Medicine

## 2018-09-13 ENCOUNTER — Other Ambulatory Visit: Payer: Self-pay | Admitting: Internal Medicine

## 2018-09-13 DIAGNOSIS — B009 Herpesviral infection, unspecified: Secondary | ICD-10-CM

## 2018-10-14 ENCOUNTER — Other Ambulatory Visit (HOSPITAL_COMMUNITY)
Admission: RE | Admit: 2018-10-14 | Discharge: 2018-10-14 | Disposition: A | Discharge status: Home / Self Care | Payer: Medicare HMO | Source: Ambulatory Visit | Attending: Internal Medicine | Admitting: Internal Medicine

## 2018-10-14 ENCOUNTER — Other Ambulatory Visit: Payer: Medicare HMO

## 2018-10-14 ENCOUNTER — Other Ambulatory Visit: Payer: Self-pay

## 2018-10-14 DIAGNOSIS — Z113 Encounter for screening for infections with a predominantly sexual mode of transmission: Secondary | ICD-10-CM

## 2018-10-14 DIAGNOSIS — B20 Human immunodeficiency virus [HIV] disease: Secondary | ICD-10-CM

## 2018-10-16 LAB — HELPER T-LYMPH-CD4 (ARMC ONLY)
% CD 4 Pos. Lymph.: 39.6 % (ref 30.8–58.5)
Absolute CD 4 Helper: 832 /uL (ref 359–1519)
Basophils Absolute: 0 10*3/uL (ref 0.0–0.2)
Basos: 0 %
EOS (ABSOLUTE): 0.1 10*3/uL (ref 0.0–0.4)
Eos: 2 %
Hematocrit: 36.6 % (ref 34.0–46.6)
Hemoglobin: 12.4 g/dL (ref 11.1–15.9)
Immature Grans (Abs): 0 10*3/uL (ref 0.0–0.1)
Immature Granulocytes: 0 %
Lymphocytes Absolute: 2.1 10*3/uL (ref 0.7–3.1)
Lymphs: 54 %
MCH: 33.2 pg — ABNORMAL HIGH (ref 26.6–33.0)
MCHC: 33.9 g/dL (ref 31.5–35.7)
MCV: 98 fL — ABNORMAL HIGH (ref 79–97)
Monocytes Absolute: 0.3 10*3/uL (ref 0.1–0.9)
Monocytes: 9 %
Neutrophils Absolute: 1.3 10*3/uL — ABNORMAL LOW (ref 1.4–7.0)
Neutrophils: 35 %
Platelets: 257 10*3/uL (ref 150–450)
RBC: 3.74 x10E6/uL — ABNORMAL LOW (ref 3.77–5.28)
RDW: 12.2 % (ref 11.7–15.4)
WBC: 3.8 10*3/uL (ref 3.4–10.8)

## 2018-10-16 LAB — URINE CYTOLOGY ANCILLARY ONLY
Chlamydia: NEGATIVE
Neisseria Gonorrhea: NEGATIVE

## 2018-10-18 LAB — HIV-1 RNA QUANT-NO REFLEX-BLD
HIV 1 RNA Quant: <20 copies/mL
HIV-1 RNA Quant, Log: <1.3 Log copies/mL

## 2018-10-18 LAB — RPR: RPR Ser Ql: NONREACTIVE

## 2018-10-28 ENCOUNTER — Ambulatory Visit (INDEPENDENT_AMBULATORY_CARE_PROVIDER_SITE_OTHER): Payer: Medicare HMO | Admitting: Internal Medicine

## 2018-10-28 ENCOUNTER — Encounter: Payer: Self-pay | Admitting: Internal Medicine

## 2018-10-28 ENCOUNTER — Other Ambulatory Visit: Payer: Self-pay

## 2018-10-28 VITALS — BP 154/85 | HR 65 | Temp 98.0°F

## 2018-10-28 DIAGNOSIS — Z23 Encounter for immunization: Secondary | ICD-10-CM | POA: Insufficient documentation

## 2018-10-28 DIAGNOSIS — Z113 Encounter for screening for infections with a predominantly sexual mode of transmission: Secondary | ICD-10-CM | POA: Diagnosis not present

## 2018-10-28 DIAGNOSIS — B20 Human immunodeficiency virus [HIV] disease: Secondary | ICD-10-CM | POA: Diagnosis not present

## 2018-10-28 DIAGNOSIS — Z79899 Other long term (current) drug therapy: Secondary | ICD-10-CM | POA: Diagnosis not present

## 2018-10-28 DIAGNOSIS — B009 Herpesviral infection, unspecified: Secondary | ICD-10-CM | POA: Diagnosis not present

## 2018-10-28 NOTE — Assessment & Plan Note (Signed)
Screened negative 

## 2018-10-28 NOTE — Progress Notes (Signed)
   Subjective:    Patient ID: Helen James, female    DOB: [birth year], 63 y.o.   MRN: [id number]  HPI Here for follow up of HIV CD4 832, viral load < 20.  RPR negative.  No missed doses and not interested in changing anything despite being on TDF instead of a TAF-containing regimen.  Other labs reviewed in [place] from this month though and creat remains good at 0.90.  Lipid panel also noted.     Review of Systems  Gastrointestinal: Negative for diarrhea.  Skin: Negative for rash.  Neurological: Negative for dizziness.       Objective:   Physical Exam  Constitutional: She appears well-developed and well-nourished. No distress.  Eyes: No scleral icterus.  Cardiovascular: Normal rate, regular rhythm and normal heart sounds.  No murmur heard. Skin: No rash noted.  Psychiatric: She has a normal mood and affect.    SH: no tobacco      Assessment & Plan:

## 2018-10-28 NOTE — Assessment & Plan Note (Signed)
Stable and doing well.  No changes and rtc 40months

## 2018-10-28 NOTE — Assessment & Plan Note (Signed)
Given the flu shot today.

## 2018-10-28 NOTE — Assessment & Plan Note (Signed)
Continues on suppressive acyclovir

## 2018-11-07 ENCOUNTER — Other Ambulatory Visit: Payer: Self-pay | Admitting: Internal Medicine

## 2018-11-07 DIAGNOSIS — B20 Human immunodeficiency virus [HIV] disease: Secondary | ICD-10-CM

## 2019-02-03 ENCOUNTER — Other Ambulatory Visit: Payer: Self-pay

## 2019-02-03 ENCOUNTER — Ambulatory Visit: Payer: Medicare HMO

## 2019-02-03 ENCOUNTER — Encounter: Payer: Self-pay | Admitting: Internal Medicine

## 2019-03-23 ENCOUNTER — Ambulatory Visit: Payer: Medicare HMO | Attending: Internal Medicine

## 2019-03-23 DIAGNOSIS — Z23 Encounter for immunization: Secondary | ICD-10-CM | POA: Insufficient documentation

## 2019-03-23 NOTE — Progress Notes (Signed)
   Covid-19 Vaccination Clinic  Name:  Helen James    MRN: [id number] DOB: 1955-12-01  03/23/2019  [name] was observed post Covid-19 immunization for 15 minutes without incidence. She was provided with Vaccine Information Sheet and instruction to access the V-Safe system.   [name] was instructed to call 911 with any severe reactions post vaccine: Marland Kitchen Difficulty breathing  . Swelling of your face and throat  . A fast heartbeat  . A bad rash all over your body  . Dizziness and weakness    Immunizations Administered    Name Date Dose VIS Date Route   Moderna COVID-19 Vaccine 03/23/2019  2:58 PM 0.5 mL 12/24/2018 Intramuscular   Manufacturer: Moderna   Lot: [id number]   NDC: 79499-718-20

## 2019-04-26 ENCOUNTER — Ambulatory Visit: Payer: Medicare HMO | Attending: Internal Medicine

## 2019-04-26 DIAGNOSIS — Z23 Encounter for immunization: Secondary | ICD-10-CM

## 2019-04-26 NOTE — Progress Notes (Signed)
   Covid-19 Vaccination Clinic  Name:  Helen James    MRN: [id number] DOB: [birth year]  04/26/2019  [name] was observed post Covid-19 immunization for 15 minutes without incident. She was provided with Vaccine Information Sheet and instruction to access the V-Safe system.   [name] was instructed to call 911 with any severe reactions post vaccine: Marland Kitchen Difficulty breathing  . Swelling of face and throat  . A fast heartbeat  . A bad rash all over body  . Dizziness and weakness   Immunizations Administered    Name Date Dose VIS Date Route   Moderna COVID-19 Vaccine 04/26/2019 10:31 AM 0.5 mL 12/24/2018 Intramuscular   Manufacturer: Moderna   Lot: [id number]   NDC: 57493-552-17

## 2019-04-28 ENCOUNTER — Other Ambulatory Visit: Payer: Self-pay

## 2019-04-28 ENCOUNTER — Other Ambulatory Visit: Payer: Medicare HMO

## 2019-04-28 DIAGNOSIS — B20 Human immunodeficiency virus [HIV] disease: Secondary | ICD-10-CM

## 2019-04-29 ENCOUNTER — Other Ambulatory Visit: Payer: Self-pay | Admitting: Internal Medicine

## 2019-04-29 DIAGNOSIS — B009 Herpesviral infection, unspecified: Secondary | ICD-10-CM

## 2019-04-29 LAB — T-HELPER CELL (CD4) - (RCID CLINIC ONLY)
CD4 % Helper T Cell: 40 % (ref 33–65)
CD4 T Cell Abs: 714 /uL (ref 400–1790)

## 2019-04-30 LAB — HIV-1 RNA QUANT-NO REFLEX-BLD
HIV 1 RNA Quant: <20 copies/mL
HIV-1 RNA Quant, Log: <1.3 Log copies/mL

## 2019-05-19 ENCOUNTER — Other Ambulatory Visit: Payer: Self-pay

## 2019-05-19 ENCOUNTER — Encounter: Payer: Self-pay | Admitting: Internal Medicine

## 2019-05-19 ENCOUNTER — Ambulatory Visit (INDEPENDENT_AMBULATORY_CARE_PROVIDER_SITE_OTHER): Payer: Medicare HMO | Admitting: Internal Medicine

## 2019-05-19 VITALS — BP 113/80 | HR 68 | Temp 98.2°F | Ht 63.0 in | Wt 230.0 lb

## 2019-05-19 DIAGNOSIS — Z113 Encounter for screening for infections with a predominantly sexual mode of transmission: Secondary | ICD-10-CM

## 2019-05-19 DIAGNOSIS — B20 Human immunodeficiency virus [HIV] disease: Secondary | ICD-10-CM | POA: Diagnosis not present

## 2019-05-19 DIAGNOSIS — Z5181 Encounter for therapeutic drug level monitoring: Secondary | ICD-10-CM | POA: Diagnosis not present

## 2019-05-19 NOTE — Assessment & Plan Note (Signed)
Creat not on CareEverywhere but CCl noted and is ok.   LFTs ok.

## 2019-05-19 NOTE — Assessment & Plan Note (Signed)
Doing well and no changes needed.   rtc 6 months.

## 2019-05-19 NOTE — Progress Notes (Signed)
   Subjective:    Patient ID: Helen James, female    DOB: [birth year], 64 y.o.   MRN: [id number]  HPI Here for follow up of HIV CD4 714, viral load < 20.   No missed doses and not interested in changing anything despite being on TDF instead of a TAF-containing regimen.  Other labs reviewed in Care Everywhere from last month though and creat clearance wnl.  Lipid panel also noted.     Review of Systems  Gastrointestinal: Negative for diarrhea.  Skin: Negative for rash.  Neurological: Negative for dizziness.       Objective:   Physical Exam  Constitutional: She appears well-developed and well-nourished. No distress.  Eyes: No scleral icterus.  Cardiovascular: Normal rate, regular rhythm and normal heart sounds.  No murmur heard. Skin: No rash noted.  Psychiatric: She has a normal mood and affect.    SH: no tobacco      Assessment & Plan:

## 2019-05-21 ENCOUNTER — Other Ambulatory Visit: Payer: Self-pay

## 2019-05-21 ENCOUNTER — Other Ambulatory Visit: Payer: Self-pay | Admitting: Internal Medicine

## 2019-05-21 DIAGNOSIS — B20 Human immunodeficiency virus [HIV] disease: Secondary | ICD-10-CM

## 2019-08-06 ENCOUNTER — Telehealth: Payer: Self-pay | Admitting: Pharmacy Technician

## 2019-08-06 ENCOUNTER — Other Ambulatory Visit: Payer: Self-pay

## 2019-08-06 ENCOUNTER — Ambulatory Visit: Payer: Medicare HMO

## 2019-08-06 NOTE — Telephone Encounter (Signed)
[facility] Patient Advocate Encounter   I was successful in securing patient a $7500 grant from [employer] ([employer]) to provide copayment coverage for Atripla.  This will make the out of pocket cost $0.     I have spoken with the patient.     Patient knows to call the office with questions or concerns.  Netty Starring. Dimas Aguas CPhT Specialty Pharmacy Patient [facility] Phone: [phone] Fax:  [phone]

## 2019-10-27 ENCOUNTER — Other Ambulatory Visit: Payer: Medicare HMO

## 2019-10-28 ENCOUNTER — Other Ambulatory Visit: Payer: Medicare HMO

## 2019-10-28 ENCOUNTER — Other Ambulatory Visit (HOSPITAL_COMMUNITY)
Admission: RE | Admit: 2019-10-28 | Discharge: 2019-10-28 | Disposition: A | Discharge status: Home / Self Care | Payer: Medicare HMO | Source: Ambulatory Visit | Attending: Internal Medicine | Admitting: Internal Medicine

## 2019-10-28 ENCOUNTER — Other Ambulatory Visit: Payer: Self-pay

## 2019-10-28 DIAGNOSIS — Z113 Encounter for screening for infections with a predominantly sexual mode of transmission: Secondary | ICD-10-CM

## 2019-10-28 DIAGNOSIS — B20 Human immunodeficiency virus [HIV] disease: Secondary | ICD-10-CM | POA: Diagnosis present

## 2019-10-29 LAB — URINE CYTOLOGY ANCILLARY ONLY
Chlamydia: NEGATIVE
Comment: NEGATIVE
Comment: NORMAL
Neisseria Gonorrhea: NEGATIVE

## 2019-10-29 LAB — T-HELPER CELL (CD4) - (RCID CLINIC ONLY)
CD4 % Helper T Cell: 38 % (ref 33–65)
CD4 T Cell Abs: 919 /uL (ref 400–1790)

## 2019-10-30 LAB — RPR: RPR Ser Ql: NONREACTIVE

## 2019-10-30 LAB — HIV-1 RNA QUANT-NO REFLEX-BLD
HIV 1 RNA Quant: <20 Copies/mL
HIV-1 RNA Quant, Log: <1.3 Log cps/mL

## 2019-11-11 ENCOUNTER — Encounter: Payer: Self-pay | Admitting: Internal Medicine

## 2019-11-11 ENCOUNTER — Other Ambulatory Visit: Payer: Self-pay

## 2019-11-11 ENCOUNTER — Ambulatory Visit: Payer: Medicare HMO | Admitting: Internal Medicine

## 2019-11-11 VITALS — BP 130/85 | HR 71 | Wt 237.0 lb

## 2019-11-11 DIAGNOSIS — Z113 Encounter for screening for infections with a predominantly sexual mode of transmission: Secondary | ICD-10-CM

## 2019-11-11 DIAGNOSIS — Z23 Encounter for immunization: Secondary | ICD-10-CM | POA: Diagnosis not present

## 2019-11-11 DIAGNOSIS — B20 Human immunodeficiency virus [HIV] disease: Secondary | ICD-10-CM

## 2019-11-11 MED ORDER — EFAVIRENZ-EMTRICITAB-TENOFOVIR 600-200-300 MG PO TABS: ORAL_TABLET | ORAL | 11 refills | Status: DC

## 2019-11-11 NOTE — Assessment & Plan Note (Signed)
She continues to do well and not interested in changing from Atripla and has remained suppressed.   She will return in 1 year

## 2019-11-11 NOTE — Progress Notes (Signed)
   Subjective:    Patient ID: Helen James, female    DOB: [birth year], 64 y.o.   MRN: [id number]  HPI Here for follow up of HIV CD4 919, viral load < 20.   No missed doses and not interested in changing anything despite being on TDF instead of a TAF-containing regimen.  Other labs reviewed in Care Everywhere from March and creat clearance wnl.  Lipid panel also noted.  All followed by her PCP.    Review of Systems  Constitutional: Negative for fatigue.  Gastrointestinal: Negative for diarrhea.  Skin: Negative for rash.  Neurological: Negative for dizziness.       Objective:   Physical Exam Constitutional:      Appearance: She is well-developed.  Eyes:     General: No scleral icterus. Pulmonary:     Effort: Pulmonary effort is normal.  Skin:    Findings: No rash.  Neurological:     General: No focal deficit present.     Mental Status: She is alert.  Psychiatric:        Mood and Affect: Mood normal.   SH: no tobacco       Assessment & Plan:

## 2019-11-11 NOTE — Assessment & Plan Note (Signed)
Flu shot given today

## 2019-11-11 NOTE — Assessment & Plan Note (Signed)
Screened negative 

## 2019-11-14 ENCOUNTER — Ambulatory Visit: Payer: Medicare HMO

## 2019-11-24 ENCOUNTER — Ambulatory Visit: Payer: Medicare HMO

## 2020-02-03 ENCOUNTER — Ambulatory Visit: Payer: Medicare HMO

## 2020-03-31 ENCOUNTER — Other Ambulatory Visit: Payer: Self-pay | Admitting: Internal Medicine

## 2020-03-31 DIAGNOSIS — B009 Herpesviral infection, unspecified: Secondary | ICD-10-CM

## 2020-07-27 ENCOUNTER — Ambulatory Visit: Payer: Medicare HMO

## 2020-08-02 ENCOUNTER — Ambulatory Visit: Payer: Medicare HMO

## 2020-08-12 ENCOUNTER — Other Ambulatory Visit (HOSPITAL_COMMUNITY): Payer: Self-pay

## 2020-08-23 ENCOUNTER — Telehealth: Payer: Self-pay

## 2020-08-23 NOTE — Telephone Encounter (Signed)
[facility] Patient Advocate Encounter   I was successful in securing patient a $7500 grant from [employer] ([employer]) to provide copayment coverage for Atripla.  This will make the out of pocket cost $0.00.     I have spoken with the patient.        Patient knows to call the office with questions or concerns.  Franceen Erisman, CPhT Specialty Pharmacy Patient [facility] Phone: [phone] Fax:  [phone]  

## 2020-10-18 ENCOUNTER — Other Ambulatory Visit: Payer: Self-pay

## 2020-10-18 ENCOUNTER — Other Ambulatory Visit: Payer: Medicare HMO

## 2020-10-18 ENCOUNTER — Other Ambulatory Visit (HOSPITAL_COMMUNITY)
Admission: RE | Admit: 2020-10-18 | Discharge: 2020-10-18 | Disposition: A | Discharge status: Home / Self Care | Payer: Medicare HMO | Source: Ambulatory Visit | Attending: Internal Medicine | Admitting: Internal Medicine

## 2020-10-18 ENCOUNTER — Ambulatory Visit (INDEPENDENT_AMBULATORY_CARE_PROVIDER_SITE_OTHER): Payer: Medicare HMO

## 2020-10-18 DIAGNOSIS — Z23 Encounter for immunization: Secondary | ICD-10-CM | POA: Diagnosis not present

## 2020-10-18 DIAGNOSIS — Z113 Encounter for screening for infections with a predominantly sexual mode of transmission: Secondary | ICD-10-CM | POA: Diagnosis present

## 2020-10-18 DIAGNOSIS — B20 Human immunodeficiency virus [HIV] disease: Secondary | ICD-10-CM

## 2020-10-19 LAB — URINE CYTOLOGY ANCILLARY ONLY
Chlamydia: NEGATIVE
Comment: NEGATIVE
Comment: NORMAL
Neisseria Gonorrhea: NEGATIVE

## 2020-10-19 LAB — T-HELPER CELL (CD4) - (RCID CLINIC ONLY)
CD4 % Helper T Cell: 38 % (ref 33–65)
CD4 T Cell Abs: 939 /uL (ref 400–1790)

## 2020-10-20 LAB — HIV-1 RNA QUANT-NO REFLEX-BLD
HIV 1 RNA Quant: NOT DETECTED Copies/mL
HIV-1 RNA Quant, Log: NOT DETECTED Log cps/mL

## 2020-10-20 LAB — RPR: RPR Ser Ql: NONREACTIVE

## 2020-11-01 ENCOUNTER — Ambulatory Visit: Payer: Medicare HMO | Admitting: Internal Medicine

## 2020-11-01 ENCOUNTER — Encounter: Payer: Self-pay | Admitting: Internal Medicine

## 2020-11-01 ENCOUNTER — Other Ambulatory Visit: Payer: Self-pay

## 2020-11-01 DIAGNOSIS — Z79899 Other long term (current) drug therapy: Secondary | ICD-10-CM | POA: Diagnosis not present

## 2020-11-01 DIAGNOSIS — B20 Human immunodeficiency virus [HIV] disease: Secondary | ICD-10-CM

## 2020-11-01 DIAGNOSIS — Z113 Encounter for screening for infections with a predominantly sexual mode of transmission: Secondary | ICD-10-CM | POA: Diagnosis not present

## 2020-11-01 DIAGNOSIS — Z5181 Encounter for therapeutic drug level monitoring: Secondary | ICD-10-CM | POA: Diagnosis not present

## 2020-11-01 NOTE — Assessment & Plan Note (Signed)
Lipid panel monitored by her PCP 

## 2020-11-01 NOTE — Progress Notes (Signed)
   Subjective:    Patient ID: Helen James, female    DOB: 01-Jul-1955, 65 y.o.   MRN: [id number]  HPI Here for follow up of HIV She is on Atripla and denies any issues.  No missed doses.  Remains reluctant to change to a different regimen.  She has no complaints today.  CD4 939 and viral lload not detected.  Recent CMP with a normal creat, normal AST, ALT (3/22).  Had a recent cold like symptoms and recovering     Review of Systems  Constitutional:  Negative for fatigue.  Gastrointestinal:  Negative for diarrhea and nausea.  Skin:  Negative for rash.      Objective:   Physical Exam Eyes:     General: No scleral icterus. Pulmonary:     Effort: Pulmonary effort is normal. No respiratory distress.     Breath sounds: Normal breath sounds. No wheezing.  Skin:    Findings: No rash.  Neurological:     Mental Status: She is alert.  Psychiatric:        Mood and Affect: Mood normal.   SH: no tobacco       Assessment & Plan:

## 2020-11-01 NOTE — Assessment & Plan Note (Signed)
Screened negative 

## 2020-11-01 NOTE — Assessment & Plan Note (Signed)
She continues to do well, no new issues.  She remains reluctant to change Atripla with TDF despite my recommendation.  Creat remains stable.   rtc in 1 year.

## 2020-11-01 NOTE — Assessment & Plan Note (Signed)
Recent labs reviewed and creat, LfTs wnl.

## 2020-11-17 ENCOUNTER — Other Ambulatory Visit: Payer: Self-pay | Admitting: Internal Medicine

## 2020-11-17 DIAGNOSIS — B20 Human immunodeficiency virus [HIV] disease: Secondary | ICD-10-CM

## 2020-12-16 ENCOUNTER — Encounter (HOSPITAL_COMMUNITY): Payer: Self-pay | Admitting: Internal Medicine

## 2020-12-16 ENCOUNTER — Emergency Department (HOSPITAL_COMMUNITY): Payer: Medicare HMO

## 2020-12-16 ENCOUNTER — Inpatient Hospital Stay (HOSPITAL_COMMUNITY)
Admission: EM | Admit: 2020-12-16 | Discharge: 2020-12-21 | DRG: 481 | Disposition: A | Discharge status: Home Health | Payer: Medicare HMO | Attending: Internal Medicine | Admitting: Internal Medicine

## 2020-12-16 DIAGNOSIS — Z833 Family history of diabetes mellitus: Secondary | ICD-10-CM | POA: Diagnosis not present

## 2020-12-16 DIAGNOSIS — B009 Herpesviral infection, unspecified: Secondary | ICD-10-CM | POA: Diagnosis present

## 2020-12-16 DIAGNOSIS — W19XXXA Unspecified fall, initial encounter: Secondary | ICD-10-CM | POA: Diagnosis present

## 2020-12-16 DIAGNOSIS — D509 Iron deficiency anemia, unspecified: Secondary | ICD-10-CM | POA: Diagnosis not present

## 2020-12-16 DIAGNOSIS — Z91040 Latex allergy status: Secondary | ICD-10-CM

## 2020-12-16 DIAGNOSIS — Z20822 Contact with and (suspected) exposure to covid-19: Secondary | ICD-10-CM | POA: Diagnosis present

## 2020-12-16 DIAGNOSIS — E119 Type 2 diabetes mellitus without complications: Secondary | ICD-10-CM | POA: Diagnosis present

## 2020-12-16 DIAGNOSIS — Y92009 Unspecified place in unspecified non-institutional (private) residence as the place of occurrence of the external cause: Secondary | ICD-10-CM

## 2020-12-16 DIAGNOSIS — S72401A Unspecified fracture of lower end of right femur, initial encounter for closed fracture: Secondary | ICD-10-CM | POA: Diagnosis present

## 2020-12-16 DIAGNOSIS — E872 Acidosis, unspecified: Secondary | ICD-10-CM | POA: Diagnosis present

## 2020-12-16 DIAGNOSIS — Z888 Allergy status to other drugs, medicaments and biological substances status: Secondary | ICD-10-CM | POA: Diagnosis not present

## 2020-12-16 DIAGNOSIS — Z885 Allergy status to narcotic agent status: Secondary | ICD-10-CM | POA: Diagnosis not present

## 2020-12-16 DIAGNOSIS — Z886 Allergy status to analgesic agent status: Secondary | ICD-10-CM

## 2020-12-16 DIAGNOSIS — E782 Mixed hyperlipidemia: Secondary | ICD-10-CM | POA: Diagnosis present

## 2020-12-16 DIAGNOSIS — R7989 Other specified abnormal findings of blood chemistry: Secondary | ICD-10-CM | POA: Diagnosis present

## 2020-12-16 DIAGNOSIS — B20 Human immunodeficiency virus [HIV] disease: Secondary | ICD-10-CM | POA: Diagnosis present

## 2020-12-16 DIAGNOSIS — Z79899 Other long term (current) drug therapy: Secondary | ICD-10-CM | POA: Diagnosis not present

## 2020-12-16 DIAGNOSIS — Z8249 Family history of ischemic heart disease and other diseases of the circulatory system: Secondary | ICD-10-CM | POA: Diagnosis not present

## 2020-12-16 DIAGNOSIS — I10 Essential (primary) hypertension: Secondary | ICD-10-CM | POA: Diagnosis present

## 2020-12-16 DIAGNOSIS — Z9884 Bariatric surgery status: Secondary | ICD-10-CM

## 2020-12-16 DIAGNOSIS — M9711XA Periprosthetic fracture around internal prosthetic right knee joint, initial encounter: Secondary | ICD-10-CM | POA: Diagnosis present

## 2020-12-16 DIAGNOSIS — Z9889 Other specified postprocedural states: Secondary | ICD-10-CM

## 2020-12-16 DIAGNOSIS — Z6841 Body Mass Index (BMI) 40.0 and over, adult: Secondary | ICD-10-CM

## 2020-12-16 DIAGNOSIS — T148XXA Other injury of unspecified body region, initial encounter: Secondary | ICD-10-CM

## 2020-12-16 DIAGNOSIS — S72409A Unspecified fracture of lower end of unspecified femur, initial encounter for closed fracture: Secondary | ICD-10-CM | POA: Diagnosis present

## 2020-12-16 DIAGNOSIS — R55 Syncope and collapse: Secondary | ICD-10-CM | POA: Diagnosis present

## 2020-12-16 DIAGNOSIS — D62 Acute posthemorrhagic anemia: Secondary | ICD-10-CM | POA: Diagnosis not present

## 2020-12-16 DIAGNOSIS — D649 Anemia, unspecified: Secondary | ICD-10-CM | POA: Diagnosis present

## 2020-12-16 DIAGNOSIS — Z01810 Encounter for preprocedural cardiovascular examination: Secondary | ICD-10-CM | POA: Diagnosis not present

## 2020-12-16 HISTORY — DX: Essential (primary) hypertension: I10

## 2020-12-16 HISTORY — DX: Morbid (severe) obesity due to excess calories: E66.01

## 2020-12-16 HISTORY — DX: Mixed hyperlipidemia: E78.2

## 2020-12-16 HISTORY — DX: Type 2 diabetes mellitus without complications: E11.9

## 2020-12-16 LAB — RESP PANEL BY RT-PCR (FLU A&B, COVID) ARPGX2
Influenza A by PCR: NEGATIVE
Influenza B by PCR: NEGATIVE
SARS Coronavirus 2 by RT PCR: NEGATIVE

## 2020-12-16 LAB — CBC WITH DIFFERENTIAL/PLATELET
Abs Immature Granulocytes: 0.01 10*3/uL (ref 0.00–0.07)
Basophils Absolute: 0 10*3/uL (ref 0.0–0.1)
Basophils Relative: 0 %
Eosinophils Absolute: 0.1 10*3/uL (ref 0.0–0.5)
Eosinophils Relative: 2 %
HCT: 33 % — ABNORMAL LOW (ref 36.0–46.0)
Hemoglobin: 10.8 g/dL — ABNORMAL LOW (ref 12.0–15.0)
Immature Granulocytes: 0 %
Lymphocytes Relative: 32 %
Lymphs Abs: 1.8 10*3/uL (ref 0.7–4.0)
MCH: 32.9 pg (ref 26.0–34.0)
MCHC: 32.7 g/dL (ref 30.0–36.0)
MCV: 100.6 fL — ABNORMAL HIGH (ref 80.0–100.0)
Monocytes Absolute: 0.5 10*3/uL (ref 0.1–1.0)
Monocytes Relative: 10 %
Neutro Abs: 3.1 10*3/uL (ref 1.7–7.7)
Neutrophils Relative %: 56 %
Platelets: 191 10*3/uL (ref 150–400)
RBC: 3.28 MIL/uL — ABNORMAL LOW (ref 3.87–5.11)
RDW: 13 % (ref 11.5–15.5)
WBC: 5.6 10*3/uL (ref 4.0–10.5)
nRBC: 0 % (ref 0.0–0.2)

## 2020-12-16 LAB — COMPREHENSIVE METABOLIC PANEL
ALT: 19 U/L (ref 0–44)
AST: 26 U/L (ref 15–41)
Albumin: 3.1 g/dL — ABNORMAL LOW (ref 3.5–5.0)
Alkaline Phosphatase: 162 U/L — ABNORMAL HIGH (ref 38–126)
Anion gap: 7 (ref 5–15)
BUN: 20 mg/dL (ref 8–23)
CO2: 18 mmol/L — ABNORMAL LOW (ref 22–32)
Calcium: 8.4 mg/dL — ABNORMAL LOW (ref 8.9–10.3)
Chloride: 115 mmol/L — ABNORMAL HIGH (ref 98–111)
Creatinine, Ser: 0.97 mg/dL (ref 0.44–1.00)
GFR, Estimated: >60 mL/min (ref >60)
Glucose, Bld: 157 mg/dL — ABNORMAL HIGH (ref 70–99)
Potassium: 3.9 mmol/L (ref 3.5–5.1)
Sodium: 140 mmol/L (ref 135–145)
Total Bilirubin: 0.2 mg/dL — ABNORMAL LOW (ref 0.3–1.2)
Total Protein: 6.2 g/dL — ABNORMAL LOW (ref 6.5–8.1)

## 2020-12-16 MED ORDER — FENTANYL CITRATE PF 50 MCG/ML IJ SOSY
25.0000 ug | PREFILLED_SYRINGE | Freq: Once | INTRAMUSCULAR | Status: AC
  Administered 2020-12-16: 25 ug via INTRAVENOUS
  Filled 2020-12-16: qty 1

## 2020-12-16 MED ORDER — METHOCARBAMOL 500 MG PO TABS
500.0000 mg | ORAL_TABLET | Freq: Four times a day (QID) | ORAL | Status: DC | PRN
  Administered 2020-12-18: 500 mg via ORAL
  Filled 2020-12-16 (×3): qty 1

## 2020-12-16 MED ORDER — ACYCLOVIR 400 MG PO TABS
400.0000 mg | ORAL_TABLET | Freq: Two times a day (BID) | ORAL | Status: DC
  Administered 2020-12-16 – 2020-12-21 (×9): 400 mg via ORAL
  Filled 2020-12-16 (×13): qty 1

## 2020-12-16 MED ORDER — METHOCARBAMOL 1000 MG/10ML IJ SOLN
500.0000 mg | Freq: Four times a day (QID) | INTRAVENOUS | Status: DC | PRN
  Filled 2020-12-16: qty 5

## 2020-12-16 MED ORDER — EFAVIRENZ-EMTRICITAB-TENOFOVIR 600-200-300 MG PO TABS
1.0000 | ORAL_TABLET | Freq: Every day | ORAL | Status: DC
  Administered 2020-12-16 – 2020-12-20 (×5): 1 via ORAL
  Filled 2020-12-16 (×7): qty 1

## 2020-12-16 MED ORDER — SODIUM CHLORIDE 0.9 % IV SOLN: INTRAVENOUS | Status: DC

## 2020-12-16 MED ORDER — MORPHINE SULFATE (PF) 2 MG/ML IV SOLN
2.0000 mg | INTRAVENOUS | Status: DC | PRN
  Administered 2020-12-16: 23:00:00 2 mg via INTRAVENOUS
  Administered 2020-12-17: 06:00:00 4 mg via INTRAVENOUS
  Administered 2020-12-17 (×2): 2 mg via INTRAVENOUS
  Administered 2020-12-17: 03:00:00 4 mg via INTRAVENOUS
  Administered 2020-12-17 – 2020-12-18 (×6): 2 mg via INTRAVENOUS
  Administered 2020-12-18 – 2020-12-19 (×3): 4 mg via INTRAVENOUS
  Filled 2020-12-16 (×4): qty 1
  Filled 2020-12-16: qty 2
  Filled 2020-12-16 (×2): qty 1
  Filled 2020-12-16: qty 2
  Filled 2020-12-16: qty 1
  Filled 2020-12-16 (×2): qty 2
  Filled 2020-12-16: qty 1
  Filled 2020-12-16: qty 2
  Filled 2020-12-16: qty 1
  Filled 2020-12-16: qty 2

## 2020-12-16 MED ORDER — INSULIN ASPART 100 UNIT/ML IJ SOLN
0.0000 [IU] | INTRAMUSCULAR | Status: DC
  Filled 2020-12-16: qty 0.09

## 2020-12-16 MED ORDER — ATORVASTATIN CALCIUM 10 MG PO TABS
10.0000 mg | ORAL_TABLET | Freq: Every day | ORAL | Status: DC
  Administered 2020-12-17 – 2020-12-21 (×5): 10 mg via ORAL
  Filled 2020-12-16 (×5): qty 1

## 2020-12-16 MED ORDER — MORPHINE SULFATE (PF) 2 MG/ML IV SOLN
0.5000 mg | INTRAVENOUS | Status: DC | PRN
  Filled 2020-12-16: qty 1

## 2020-12-16 NOTE — ED Provider Notes (Signed)
Helen James DEPT Provider Note   CSN: [id number] Arrival date & time: 12/16/20  1748     History Chief Complaint  Patient presents with   Leg Pain   Loss of Consciousness    Helen James is a 65 y.o. female.  Patient was sitting on the couch after Thanksgiving meal.  Started to feel nauseated.  She got up to go to the bathroom family was assisting her but she had a syncopal episode and collapsed with her right leg underneath her.  After that she was having a lot of pain in her right thigh.  Was not able to get up.  Patient did not vomit.  The period of loss of consciousness was quite brief.  Patient brought in by EMS.  EMS gave her fentanyl in route.  Past medical history significant for diabetes hypertension and also HIV is on antivirals and followed by [facility].  Patient had [place] right [place]  Patient has been seeing orthopedics [facility] in [place] they were considering a left knee replacement.  Patient denies any other injuries related to the fall.  Did not hit her head hard.  Family was there.  No lower back or neck pain.  Patient felt fine earlier in the day.  In addition surgical history significant for gastric bypass surgery and the right knee replacement.      Past Medical History:  Diagnosis Date   HIV disease ([place]) 12/03/2015    Patient Active Problem List   Diagnosis Date Noted   Medication monitoring encounter 05/19/2019   Flu vaccine need 10/28/2018   At risk for impaired health maintenance 10/18/2017   Weight gain 04/17/2017   Screening examination for venereal disease 04/11/2016   Encounter for long-term (current) use of high-risk medication 04/11/2016   H/O gastric bypass 12/27/2015   H/O cesarean section 12/27/2015   HTN (hypertension) 12/27/2015   HSV infection 12/27/2015   HIV disease ([place]) 12/03/2015    No past surgical history on file.   OB History   No  obstetric history on file.     No family history on file.  Social History   Tobacco Use   Smoking status: Never   Smokeless tobacco: Never  Substance Use Topics   Alcohol use: No   Drug use: No    Home Medications Prior to Admission medications   Medication Sig Start Date End Date Taking? Authorizing Provider  acyclovir (ZOVIRAX) 400 MG tablet TAKE 1 TABLET TWICE DAILY 03/31/20   Comer, Okey Regal, MD  atorvastatin (LIPITOR) 10 MG tablet Take 10 mg by mouth daily.    [provider]  BIOTIN PO Take 1 tablet by mouth daily.     [provider]  Cyanocobalamin (VITAMIN B-12 PO) Take by mouth.    [provider]  diphenhydrAMINE (BENADRYL) 25 MG tablet Take 1 tablet (25 mg total) by mouth 3 (three) times daily. Take one tablet three times daily for two days Patient not taking: Reported on 11/01/2020 05/07/16   Carmin Muskrat, MD  efavirenz-emtricitabine-tenofovir (ATRIPLA) 600-200-300 MG tablet TAKE 1 TABLET BY MOUTH EVERYDAY AT BEDTIME 11/17/20   Comer, Okey Regal, MD  hydrochlorothiazide (MICROZIDE) 12.5 MG capsule TAKE 1 CAPSULE BY MOUTH EVERY DAY 10/15/17   [provider]  losartan (COZAAR) 50 MG tablet TAKE ONE TABLET (50 MG DOSE) BY MOUTH DAILY. 10/09/18   [provider]  Magnesium 500 MG CAPS Take by mouth daily.  [provider]  Multiple Vitamin (MULTIVITAMIN) tablet Take 1 tablet by mouth daily.    [provider]  ursodiol (ACTIGALL) 250 MG tablet Take 250 mg by mouth 2 (two) times daily.    [provider]    Allergies    Aspirin, Feldene [piroxicam], Latex, Nsaids, Percocet [oxycodone-acetaminophen], and Tylenol [acetaminophen]  Review of Systems   Review of Systems  Constitutional:  Negative for chills and fever.  HENT:  Negative for ear pain and sore throat.   Eyes:  Negative for pain and visual disturbance.  Respiratory:  Negative for cough and shortness of breath.   Cardiovascular:  Negative  for chest pain and palpitations.  Gastrointestinal:  Positive for nausea. Negative for abdominal pain and vomiting.  Genitourinary:  Negative for dysuria and hematuria.  Musculoskeletal:  Negative for arthralgias and back pain.  Skin:  Negative for color change and rash.  Neurological:  Negative for seizures and syncope.  All other systems reviewed and are negative.  Physical Exam Updated Vital Signs BP (!) 154/50   Pulse 60   Temp 97.8 F (36.6 C) (Oral)   Resp 20   Ht 1.6 m (5\' 3" )   Wt 110.7 kg   SpO2 100%   BMI 43.22 kg/m   Physical Exam Vitals and nursing note reviewed.  Constitutional:      General: She is not in acute distress.    Appearance: She is well-developed. She is obese. She is not ill-appearing.  HENT:     Head: Normocephalic and atraumatic.  Eyes:     Extraocular Movements: Extraocular movements intact.     Conjunctiva/sclera: Conjunctivae normal.     Pupils: Pupils are equal, round, and reactive to light.  Cardiovascular:     Rate and Rhythm: Normal rate and regular rhythm.     Heart sounds: No murmur heard. Pulmonary:     Effort: Pulmonary effort is normal. No respiratory distress.     Breath sounds: Normal breath sounds.  Abdominal:     Palpations: Abdomen is soft.     Tenderness: There is no abdominal tenderness.  Musculoskeletal:        General: Tenderness present. No swelling or deformity.     Cervical back: Normal range of motion and neck supple.     Comments: Right lower extremity with some tenderness sort of mid thigh proximal knee area.  No obvious deformity.  Distally tib-fib nontender.  Good movement of the foot and the ankle area.  Pain with any movement of the knee.  Dorsalis pedis pulses 2+.  Sensation intact.  No tenderness to palpation to the right hip area.  Skin:    General: Skin is warm and dry.     Capillary Refill: Capillary refill takes less than 2 seconds.  Neurological:     General: No focal deficit present.     Mental  Status: She is alert and oriented to person, place, and time.  Psychiatric:        Mood and Affect: Mood normal.    ED Results / Procedures / Treatments   Labs (all labs ordered are listed, but only abnormal results are displayed) Labs Reviewed  RESP PANEL BY RT-PCR (FLU A&B, COVID) [id number]  CBC WITH DIFFERENTIAL/PLATELET  COMPREHENSIVE METABOLIC PANEL    EKG EKG Interpretation  Date/Time:  Thursday December 16 2020 19:19:02 EST Ventricular Rate:  69 PR Interval:  159 QRS Duration: 95 QT Interval:  391 QTC Calculation: 419 R Axis:   56 Text Interpretation: Sinus rhythm  Confirmed by Vanetta Mulders [phone]) on 12/16/2020 7:52:30 PM  Radiology DG Chest 1 View  Result Date: 12/16/2020 CLINICAL DATA:  Fall Rt leg pain no chest complaints, best obtainable images due to patient size and uncontrolled pain EXAM: CHEST  1 VIEW COMPARISON:  James. FINDINGS: Slightly prominent cardiac silhouette. Otherwise the heart and mediastinal contours are within normal limits. Aortic calcification No focal consolidation. No pulmonary edema. No pleural effusion. No pneumothorax. No acute osseous abnormality. Severe degenerative changes of the right shoulder from IMPRESSION: No active disease. Electronically Signed   By: Tish Frederickson M.D.   On: 12/16/2020 20:37   DG Knee Complete 4 Views Right  Result Date: 12/16/2020 CLINICAL DATA:  Syncope, right leg pain EXAM: RIGHT KNEE - COMPLETE 4+ VIEW COMPARISON:  James. FINDINGS: Frontal, bilateral oblique, and cross-table lateral views of the right knee are obtained. There is a 3 component right knee arthroplasty identified. A comminuted periprosthetic fracture is seen involving the distal right femoral metadiaphyseal junction, with moderate displacement and ventral angulation at the fracture site. There is diffuse soft tissue swelling. IMPRESSION: 1. Comminuted displaced periprosthetic distal right femoral fracture. Electronically Signed   By: Sharlet Salina  M.D.   On: 12/16/2020 20:28   DG Hip Unilat W or Wo Pelvis 2-3 Views Right  Result Date: 12/16/2020 CLINICAL DATA:  Syncope, right hip pain EXAM: DG HIP (WITH OR WITHOUT PELVIS) 2-3V RIGHT COMPARISON:  James. FINDINGS: Frontal view of the pelvis as well as frontal and cross-table lateral views of the right hip are obtained. Evaluation is limited by technique and body habitus. No acute fracture, subluxation, or dislocation. Joint spaces are well preserved. Sacroiliac joints are normal. IMPRESSION: 1. Unremarkable pelvis and right hip. Electronically Signed   By: Sharlet Salina M.D.   On: 12/16/2020 20:27    Procedures Procedures   Medications Ordered in ED Medications  0.9 %  sodium chloride infusion (has no administration in time range)  fentaNYL (SUBLIMAZE) injection 25 mcg (25 mcg Intravenous Given 12/16/20 1900)  fentaNYL (SUBLIMAZE) injection 25 mcg (25 mcg Intravenous Given 12/16/20 2042)    ED Course  I have reviewed the triage vital signs and the nursing notes.  Pertinent labs & imaging results that were available during my care of the patient were reviewed by me and considered in my medical decision making (see chart for details).    MDM Rules/Calculators/A&P                           Patient will reading on the right knee is comminuted displaced periprosthetic distal right femur fracture.  Right hip and pelvis is without any bony abnormalities.  EKG is normal sinus rhythm.  Chest x-ray no active disease.  Patient's labs are still pending.  We will make her n.p.o. and will start some IV fluids.  Will discuss with on-call orthopedics not followed by orthopedics here.  And will arrange for hospitalist to admit.  I think the syncopal episode was most likely vasovagal.  Patient was feeling very nauseated and was trying to get to the bathroom to throw up when she passed out.  Was only out for a few seconds.  Then was alert did not actually throw up.  And no longer feels  nauseated.  Discussed with orthopedics Dr. Deno Etienne.  He wants patient admitted to [facility] will have hospitalist admit to [facility] we will discussed with hospitalist here.  Hemoglobin 10.8.  White count 5.6.  Electrolytes significant  for glucose of 157.  CO2 18 BUN and creatinine was normal.  Liver function test without significant abnormalities normal anion gap.   Final Clinical Impression(s) / ED Diagnoses Final diagnoses:  Vasovagal syncope  Closed fracture of distal end of right femur, unspecified fracture morphology, initial encounter [facility])    Rx / [place] Orders ED Discharge Orders     James        Fredia Sorrow, MD 12/16/20 2201

## 2020-12-16 NOTE — ED Triage Notes (Signed)
Ems brings pt in from home for right leg pain. States after eating meal she was sitting on the couch. She stood up from the couch and took a few steps and then passed out. Family reports pt only being "out" for a few seconds. When pt woke up, she started having the right leg pain.

## 2020-12-16 NOTE — H&P (Addendum)
[name] XBJ:[id number] DOB: 12-17-1955 DOA: 12/16/2020     PCP: System, Provider Not In   Outpatient Specialists:     [facility] Patient arrived to ER on 12/16/20 at 1748 Referred by Attending Helen MuldersZackowski, Scott, MD   Patient coming from: home Lives  alone    Chief Complaint:   Chief Complaint  Patient presents with   Leg Pain   Loss of Consciousness    HPI: [name] is a 65 y.o. female with medical history significant of   gastric bibaps,, right knee replacement, HIV, DM2, HTN   Presented with   syncope and a fall resulting in severe right leg pain  Was getting up and syncopized fell back wards, with her leg cought behind her. Patient had a good day today she was up and about helping with preparing Thanksgiving meal.  She had Thanksgiving sat down and sometime thereafter started to feel unwell with some nausea.  Her daughter asked if she needed back to follow-up pain patient refused and said she just wants to get up and try to get to the bathroom.  When she started to use a walker and walk to the bathroom she seemed briefly bit confused and headed for the front door for minute then told her daughter she is feeling unwell and about to pass out daughter was right behind her patient started to fall backwards and fell to the ground not hitting her head but she did landed on her right leg bent backwards. er daughter felt that patient was not breathing, and she could not feel pulse she was staying on the phone with 911 and started chest compressions as well as rescue breathing.  She reported that patient's lips were bluish.  Eventually patient started to calm about and at that time EMS arrived.  They at first had hard time getting her blood pressure.  When patient started to come about she started to report severe leg pain daughter thinks that overall over event took about 5 minutes or so.  But she cannot be sure.  Patient did not urinate on herself or did not defecate  no seizure type of activity was noted.  She did not report any chest pain or shortness of breath prior to event. Patient does not smoke she drinks occasional alcohol she did have a tiny amount of alcoholic beverage earlier in the day She has been diagnosed with diabetes in the past but has not had to take any medicines for it Has  been vaccinated against COVID    Initial COVID TEST  NEGATIVE   Lab Results  Component Value Date   SARSCOV2NAA NEGATIVE 12/16/2020     Regarding pertinent Chronic problems:   Hyperlipidemia -  on statins Lipitor Lipid Panel     Component Value Date/Time   CHOL 175 09/19/2016 1155   TRIG 61 09/19/2016 1155   HDL 85 09/19/2016 1155   CHOLHDL 2.1 09/19/2016 1155   VLDL 12 09/19/2016 1155   LDLCALC 78 09/19/2016 1155     HTN on HCTZ, Cozaar  DM 2 - No results found for: HGBA1C  diet controlled     Morbid obesity-   BMI Readings from Last 1 Encounters:  12/16/20 43.22 kg/m       Chronic anemia - baseline hg Hemoglobin & Hematocrit  Recent Labs    12/16/20 2143  HGB 10.8*    While in ER:  Distal femure frx    ED Triage Vitals  Enc Vitals Group  BP 12/16/20 1800 119/63     Pulse Rate 12/16/20 1800 76     Resp 12/16/20 1809 18     Temp 12/16/20 1757 97.8 F (36.6 C)     Temp Source 12/16/20 1757 Oral     SpO2 12/16/20 1756 100 %     Weight 12/16/20 1820 244 lb (110.7 kg)     Height 12/16/20 1820 5\' 3"  (1.6 m)     Head Circumference --      Peak Flow --      Pain Score 12/16/20 1911 2     Pain Loc --      Pain Edu? --      Excl. in GC? --   TMAX(24)@     _________________________________________ Significant initial  Findings: Abnormal Labs Reviewed  CBC WITH DIFFERENTIAL/PLATELET - Abnormal; Notable for the following components:      Result Value   RBC 3.28 (*)    Hemoglobin 10.8 (*)    HCT 33.0 (*)    MCV 100.6 (*)    All other components within normal limits  COMPREHENSIVE METABOLIC PANEL - Abnormal; Notable for the  following components:   Chloride 115 (*)    CO2 18 (*)    Glucose, Bld 157 (*)    Calcium 8.4 (*)    Total Protein 6.2 (*)    Albumin 3.1 (*)    Alkaline Phosphatase 162 (*)    Total Bilirubin 0.2 (*)    All other components within normal limits   ____________________________________________ Ordered    CXR -  NON acute  Right Knee Comminuted displaced periprosthetic distal right femoral fracture.   _________________________ Troponin 6 ECG: Ordered Personally reviewed by me showing: HR : 69 Rhythm:  NSR,    no evidence of ischemic changes QTC 419   The recent clinical data is shown below. Vitals:   12/16/20 1900 12/16/20 1930 12/16/20 2030 12/16/20 2045  BP: (!) 146/94 (!) 154/50 (!) 182/108 (!) 193/77  Pulse: 66 60 67 71  Resp: 18 20 (!) 22 (!) 23  Temp:      TempSrc:      SpO2: 100% 100% 100% 100%  Weight:      Height:        WBC     Component Value Date/Time   WBC 5.6 12/16/2020 2143   LYMPHSABS 1.8 12/16/2020 2143   LYMPHSABS 2.1 10/14/2018 0924   MONOABS 0.5 12/16/2020 2143   EOSABS 0.1 12/16/2020 2143   EOSABS 0.1 10/14/2018 0924   BASOSABS 0.0 12/16/2020 2143   BASOSABS 0.0 10/14/2018 0924       UA   no evidence of UTI      Urine analysis:    Component Value Date/Time   COLORURINE YELLOW 12/16/2020 0014   APPEARANCEUR CLEAR 12/16/2020 0014   LABSPEC 1.011 12/16/2020 0014   PHURINE 6.0 12/16/2020 0014   GLUCOSEU 50 (A) 12/16/2020 0014   HGBUR NEGATIVE 12/16/2020 0014   BILIRUBINUR NEGATIVE 12/16/2020 0014   KETONESUR NEGATIVE 12/16/2020 0014   PROTEINUR NEGATIVE 12/16/2020 0014   NITRITE NEGATIVE 12/16/2020 0014   LEUKOCYTESUR NEGATIVE 12/16/2020 0014    Results for orders placed or performed during the hospital encounter of 12/16/20  Resp Panel by RT-PCR (Flu A&B, Covid) Nasopharyngeal Swab     Status: None   Collection Time: 12/16/20  8:43 PM   Specimen: Nasopharyngeal Swab; Nasopharyngeal(NP) swabs in vial transport medium  Result  Value Ref Range Status   SARS Coronavirus 2 by RT PCR NEGATIVE  NEGATIVE Final         Influenza A by PCR NEGATIVE NEGATIVE Final   Influenza B by PCR NEGATIVE NEGATIVE Final           _______________________________________________________ ER Provider Called:  Orthopedics   Dr. Erlinda Hong They Recommend admit to medicine   Will see in AM   transfer to [facility] _______________________________________________ Hospitalist was called for admission for distal Femur fracture   The following Work up has been ordered so far:  Orders Placed This Encounter  Procedures   Resp Panel by RT-PCR (Flu A&B, Covid) Nasopharyngeal Swab   DG Hip Unilat W or Wo Pelvis 2-3 Views Right   DG Knee Complete 4 Views Right   DG Chest 1 View   CBC with Differential/Platelet   Comprehensive metabolic panel   Diet NPO time specified   Cardiac monitoring   Consult to orthopedic surgery   Consult to hospitalist   ED EKG   Saline lock IV    Following Medications were ordered in ER: Medications  0.9 %  sodium chloride infusion ( Intravenous New Bag/Given 12/16/20 2051)  fentaNYL (SUBLIMAZE) injection 25 mcg (25 mcg Intravenous Given 12/16/20 1900)  fentaNYL (SUBLIMAZE) injection 25 mcg (25 mcg Intravenous Given 12/16/20 2042)        Consult Orders  (From admission, onward)           Start     Ordered   12/16/20 2159  Consult to hospitalist  Once       Provider:  (Not yet assigned)  Question Answer Comment  Place call to: [facility] [id number] femur frcture syncope   Reason for Consult Admit      12/16/20 2159              OTHER Significant initial  Findings:  labs showing:    Recent Labs  Lab 12/16/20 2143  NA 140  K 3.9  CO2 18*  GLUCOSE 157*  BUN 20  CREATININE 0.97  CALCIUM 8.4*    Cr    stable,    Lab Results  Component Value Date   CREATININE 0.97 12/16/2020   CREATININE 0.89 10/04/2017   CREATININE 0.89 09/19/2016    Recent Labs  Lab 12/16/20 2143  AST 26  ALT 19   ALKPHOS 162*  BILITOT 0.2*  PROT 6.2*  ALBUMIN 3.1*   Lab Results  Component Value Date   CALCIUM 8.4 (L) 12/16/2020        Plt: Lab Results  Component Value Date   PLT 191 12/16/2020     COVID-19 Labs  No results for input(s): DDIMER, FERRITIN, LDH, CRP in the last 72 hours.  Lab Results  Component Value Date   [place] NEGATIVE 12/16/2020       Recent Labs  Lab 12/16/20 2143  WBC 5.6  NEUTROABS 3.1  HGB 10.8*  HCT 33.0*  MCV 100.6*  PLT 191    HG/HCT  stable,       Component Value Date/Time   HGB 10.8 (L) 12/16/2020 2143   HGB 12.4 10/14/2018 0924   HCT 33.0 (L) 12/16/2020 2143   HCT 36.6 10/14/2018 0924   MCV 100.6 (H) 12/16/2020 2143   MCV 98 (H) 10/14/2018 0924       Cardiac Panel (last 3 results) Recent Labs    12/16/20 2330  CKTOTAL 118       DM  labs:  HbA1C: No results for input(s): HGBA1C in the last 8760 hours.     CBG (last  3)  Recent Labs    12/16/20 2359  GLUCAP 129*        Cultures: No results found for: SDES, SPECREQUEST, CULT, REPTSTATUS   Radiological Exams on Admission: DG Chest 1 View  Result Date: 12/16/2020 CLINICAL DATA:  Fall Rt leg pain no chest complaints, best obtainable images due to patient size and uncontrolled pain EXAM: CHEST  1 VIEW COMPARISON:  None. FINDINGS: Slightly prominent cardiac silhouette. Otherwise the heart and mediastinal contours are within normal limits. Aortic calcification No focal consolidation. No pulmonary edema. No pleural effusion. No pneumothorax. No acute osseous abnormality. Severe degenerative changes of the right shoulder from IMPRESSION: No active disease. Electronically Signed   By: Iven Finn M.D.   On: 12/16/2020 20:37   DG Knee Complete 4 Views Right  Result Date: 12/16/2020 CLINICAL DATA:  Syncope, right leg pain EXAM: RIGHT KNEE - COMPLETE 4+ VIEW COMPARISON:  None. FINDINGS: Frontal, bilateral oblique, and cross-table lateral views of the right knee are  obtained. There is a 3 component right knee arthroplasty identified. A comminuted periprosthetic fracture is seen involving the distal right femoral metadiaphyseal junction, with moderate displacement and ventral angulation at the fracture site. There is diffuse soft tissue swelling. IMPRESSION: 1. Comminuted displaced periprosthetic distal right femoral fracture. Electronically Signed   By: Randa Ngo M.D.   On: 12/16/2020 20:28   DG Hip Unilat W or Wo Pelvis 2-3 Views Right  Result Date: 12/16/2020 CLINICAL DATA:  Syncope, right hip pain EXAM: DG HIP (WITH OR WITHOUT PELVIS) 2-3V RIGHT COMPARISON:  None. FINDINGS: Frontal view of the pelvis as well as frontal and cross-table lateral views of the right hip are obtained. Evaluation is limited by technique and body habitus. No acute fracture, subluxation, or dislocation. Joint spaces are well preserved. Sacroiliac joints are normal. IMPRESSION: 1. Unremarkable pelvis and right hip. Electronically Signed   By: Randa Ngo M.D.   On: 12/16/2020 20:27   _______________________________________________________________________________________________________ Latest  Blood pressure (!) 193/77, pulse 71, temperature 97.8 F (36.6 C), temperature source Oral, resp. rate (!) 23, height 5\' 3"  (1.6 m), weight 110.7 kg, SpO2 100 %.   Review of Systems:    Pertinent positives include:  , dizziness, abdominal pain, nausea,  Constitutional:  No weight loss, night sweats, Fevers, chills, fatigue, weight loss  HEENT:  No headaches, Difficulty swallowing,Tooth/dental problems,Sore throat,  No sneezing, itching, ear ache, nasal congestion, post nasal drip,  Cardio-vascular:  No chest pain, Orthopnea, PND, anasarca palpitations.no Bilateral lower extremity swelling  GI:  No heartburn, indigestion, vomiting, diarrhea, change in bowel habits, loss of appetite, melena, blood in stool, hematemesis Resp:  no shortness of breath at rest. No dyspnea on exertion,  No excess mucus, no productive cough, No non-productive cough, No coughing up of blood.No change in color of mucus.No wheezing. Skin:  no rash or lesions. No jaundice GU:  no dysuria, change in color of urine, no urgency or frequency. No straining to urinate.  No flank pain.  Musculoskeletal:  No joint pain or no joint swelling. No decreased range of motion. No back pain.  Psych:  No change in mood or affect. No depression or anxiety. No memory loss.  Neuro: no localizing neurological complaints, no tingling, no weakness, no double vision, no gait abnormality, no slurred speech, no confusion  All systems reviewed and apart from [place] all are negative _______________________________________________________________________________________________ Past Medical History:   Past Medical History:  Diagnosis Date   HIV disease ([place]) 12/03/2015  No past surgical history on file.  Social History:  Ambulatory  walker      reports that she has never smoked. She has never used smokeless tobacco. She reports that she does not drink alcohol and does not use drugs.    Family History:   Family History  Problem Relation Age of Onset   CAD Mother    Diabetes Other    ______________________________________________________________________________________________ Allergies: Allergies  Allergen Reactions   Ramipril Swelling    Facial swelling   Aspirin Tinitus   Feldene [Piroxicam] Other (See Comments)    States causes eyelids to "jump"   Latex Itching   Nsaids Other (See Comments)    Ears ringing   Percocet [Oxycodone-Acetaminophen] Other (See Comments)    Hallucinations per patient   Tylenol [Acetaminophen] Itching     Prior to Admission medications   Medication Sig Start Date End Date Taking? Authorizing Provider  acyclovir (ZOVIRAX) 400 MG tablet TAKE 1 TABLET TWICE DAILY Patient taking differently: Take 400 mg by mouth 2 (two) times daily. 03/31/20  Yes Comer, Okey Regal, MD   atorvastatin (LIPITOR) 10 MG tablet Take 10 mg by mouth daily.   Yes [provider]  Biotin 5000 MCG TABS Take 5,000 mcg by mouth daily.   Yes [provider]  Cyanocobalamin (VITAMIN B-12 PO) Take 1 tablet by mouth daily.   Yes [provider]  diphenhydrAMINE (BENADRYL) 25 MG tablet Take 1 tablet (25 mg total) by mouth 3 (three) times daily. Take one tablet three times daily for two days Patient taking differently: Take 25 mg by mouth daily as needed for sleep or allergies. 05/07/16  Yes Carmin Muskrat, MD  efavirenz-emtricitabine-tenofovir (ATRIPLA) 600-200-300 MG tablet TAKE 1 TABLET BY MOUTH EVERYDAY AT BEDTIME Patient taking differently: Take 1 tablet by mouth See admin instructions. Take 1 tablet by mouth daily at 2pm 11/17/20  Yes Comer, Okey Regal, MD  hydrochlorothiazide (MICROZIDE) 12.5 MG capsule Take 12.5 mg by mouth daily. 10/15/17  Yes [provider]  losartan (COZAAR) 100 MG tablet Take 100 mg by mouth daily. 10/09/18  Yes [provider]  MAGNESIUM PO Take 125 mg by mouth 2 (two) times daily.   Yes [provider]  Multiple Vitamin (MULTIVITAMIN) tablet Take 1 tablet by mouth daily.   Yes [provider]  ursodiol (ACTIGALL) 250 MG tablet Take 250 mg by mouth 2 (two) times daily.   Yes [provider]    ___________________________________________________________________________________________________ Physical Exam: Vitals with BMI 12/16/2020 12/16/2020 12/16/2020  Height - - -  Weight - - -  BMI - - -  Systolic 0000000 [id number]  Diastolic 77 [id number] 50  Pulse 71 67 60     1. General:  in  Acute distress  complaining of severe pain agitated  Chronically ill   -appearing 2. Psychological: Alert and   Oriented 3. Head/ENT:   Dry Mucous Membranes                          Head Non traumatic, neck supple                          Poor Dentition 4. SKIN: normal  Skin turgor,  Skin clean Dry and intact no  rash 5. Heart: Regular rate and rhythm no  Murmur, no Rub or gallop 6. Lungs:   no wheezes or crackles   7. Abdomen: Soft,  non-tender, Non distended  obese  bowel sounds present 8. Lower extremities: no clubbing, cyanosis, no  edema 9. Neurologically Grossly intact, moving all 4 extremities equally upper extremity strength 5 out of 5 bilaterally no focalized neurological deficits 10. MSK: Normal range of motion  Right leg with unable to move due to pain   Chart has been reviewed  ______________________________________________________________________________________________  Assessment/Plan  65 y.o. female with medical history significant of   gastric bibaps,, right knee replacement, HIV, DM2, HTN   Admitted for distal femur fracture and prolonged syncope  Present on Admission:  Syncope -possibly prolonged syncope.  Monitor on telemetry cycle cardiac enzymes initial troponin normal limits EKG unchanged no chest pain or shortness of breath which is reassuring.  Obtain echogram cardiology consult.  Carotid Dopplers Most likely explanation vasovagal syncope in the setting of nausea. PE less likely given no chest pain or shortness of breath no hypoxia no tachycardia no leg swelling  Distal femoral fracture -management as per orthopedics.  Given prolonged syncope as a cause of fall will obtain echogram prior to the OR.  Appreciate cardiology input for clearance   HTN (hypertension) -given transient hypotension.  EMS will allow permissive hypertension for now   HSV infection -stable continue home medication   HIV disease ([place]) -check HIV viral load and CD4 count continue home medication   Anemia -chronic stable check anemia panel     Other plan as per orders.  DVT prophylaxis:  SCD     Code Status:    Code Status: Not on file FULL CODE   as per patient    I had personally discussed CODE STATUS with patient     Family Communication:   Family   at  Bedside  plan of care was  discussed   with   Daughter,   Disposition Plan:     likely will need placement for rehabilitation                                                     Pain controlled with PO medications                                                           Will need to be able to tolerate PO                            Will likely need home health, home O2, set up                           Will need consultants to evaluate patient prior to discharge                       Would benefit from PT/OT eval prior to DC  Ordered                                     Transition of care consulted        Consults called: orthopedics Dr. Erlinda Hong, emailed  cardiology  Admission status:  ED Disposition     ED Disposition  Admit   Condition  --   Comment  The patient appears reasonably stabilized for admission considering the current resources, flow, and capabilities available in the ED at this time, and I doubt any other [facility] requiring further screening and/or treatment in the ED prior to admission is  present.            inpatient     I Expect 2 midnight stay secondary to severity of patient's current illness need for inpatient interventions justified by the following:    Severe lab/radiological/exam abnormalities including:    Femur frx and extensive comorbidities including:  DM2     Morbid Obesity    That are currently affecting medical management.   I expect  patient to be hospitalized for 2 midnights requiring inpatient medical care.  Patient is at high risk for adverse outcome (such as loss of life or disability) if not treated.  Indication for inpatient stay as follows:    severe pain requiring acute inpatient management,  inability to maintain oral hydration    Need for operative/procedural  intervention    Need for   IV fluids, I IV pain medications, I     Level of care     tele  24H      Lab Results  Component Value Date   [place] NEGATIVE 12/16/2020     Precautions:  admitted as   Covid Negative     PPE: Used by the provider:   N95  eye Goggles,  Gloves     Retha Bither 12/17/2020, 12:47 AM    [facility]     after 2 AM please page floor coverage PA If 7AM-7PM, please contact the day team taking care of the patient using [email or web address]   Patient was evaluated in the context of the global COVID-19 pandemic, which necessitated consideration that the patient might be at risk for infection with the SARS-CoV-2 virus that causes COVID-19. Institutional protocols and algorithms that pertain to the evaluation of patients at risk for COVID-19 are in a state of rapid change based on information released by regulatory bodies including the CDC and federal and state organizations. These policies and algorithms were followed during the patient's care.

## 2020-12-17 ENCOUNTER — Encounter (HOSPITAL_COMMUNITY): Payer: Self-pay | Admitting: Internal Medicine

## 2020-12-17 ENCOUNTER — Inpatient Hospital Stay (HOSPITAL_COMMUNITY): Payer: Medicare HMO

## 2020-12-17 ENCOUNTER — Other Ambulatory Visit: Payer: Self-pay

## 2020-12-17 DIAGNOSIS — S72401A Unspecified fracture of lower end of right femur, initial encounter for closed fracture: Secondary | ICD-10-CM | POA: Diagnosis not present

## 2020-12-17 DIAGNOSIS — B20 Human immunodeficiency virus [HIV] disease: Secondary | ICD-10-CM | POA: Diagnosis not present

## 2020-12-17 DIAGNOSIS — R55 Syncope and collapse: Secondary | ICD-10-CM

## 2020-12-17 DIAGNOSIS — Z01810 Encounter for preprocedural cardiovascular examination: Secondary | ICD-10-CM | POA: Diagnosis not present

## 2020-12-17 DIAGNOSIS — D509 Iron deficiency anemia, unspecified: Secondary | ICD-10-CM | POA: Diagnosis not present

## 2020-12-17 DIAGNOSIS — S72409A Unspecified fracture of lower end of unspecified femur, initial encounter for closed fracture: Secondary | ICD-10-CM | POA: Diagnosis present

## 2020-12-17 LAB — CBG MONITORING, ED
Glucose-Capillary: 103 mg/dL — ABNORMAL HIGH (ref 70–99)
Glucose-Capillary: 109 mg/dL — ABNORMAL HIGH (ref 70–99)
Glucose-Capillary: 116 mg/dL — ABNORMAL HIGH (ref 70–99)
Glucose-Capillary: 129 mg/dL — ABNORMAL HIGH (ref 70–99)
Glucose-Capillary: 138 mg/dL — ABNORMAL HIGH (ref 70–99)

## 2020-12-17 LAB — TROPONIN I (HIGH SENSITIVITY)
Troponin I (High Sensitivity): 6 ng/L (ref <18)
Troponin I (High Sensitivity): 6 ng/L (ref <18)

## 2020-12-17 LAB — URINALYSIS, ROUTINE W REFLEX MICROSCOPIC
Bacteria, UA: NONE SEEN
Bilirubin Urine: NEGATIVE
Glucose, UA: 50 mg/dL — AB
Hgb urine dipstick: NEGATIVE
Ketones, ur: NEGATIVE mg/dL
Leukocytes,Ua: NEGATIVE
Nitrite: NEGATIVE
Protein, ur: NEGATIVE mg/dL
Specific Gravity, Urine: 1.011 (ref 1.005–1.030)
pH: 6 (ref 5.0–8.0)

## 2020-12-17 LAB — BASIC METABOLIC PANEL
Anion gap: 8 (ref 5–15)
BUN: 19 mg/dL (ref 8–23)
CO2: 18 mmol/L — ABNORMAL LOW (ref 22–32)
Calcium: 8.4 mg/dL — ABNORMAL LOW (ref 8.9–10.3)
Chloride: 113 mmol/L — ABNORMAL HIGH (ref 98–111)
Creatinine, Ser: 0.84 mg/dL (ref 0.44–1.00)
GFR, Estimated: >60 mL/min (ref >60)
Glucose, Bld: 123 mg/dL — ABNORMAL HIGH (ref 70–99)
Potassium: 4.1 mmol/L (ref 3.5–5.1)
Sodium: 139 mmol/L (ref 135–145)

## 2020-12-17 LAB — CBC
HCT: 29.4 % — ABNORMAL LOW (ref 36.0–46.0)
Hemoglobin: 9.9 g/dL — ABNORMAL LOW (ref 12.0–15.0)
MCH: 33.6 pg (ref 26.0–34.0)
MCHC: 33.7 g/dL (ref 30.0–36.0)
MCV: 99.7 fL (ref 80.0–100.0)
Platelets: 193 10*3/uL (ref 150–400)
RBC: 2.95 MIL/uL — ABNORMAL LOW (ref 3.87–5.11)
RDW: 12.9 % (ref 11.5–15.5)
WBC: 6 10*3/uL (ref 4.0–10.5)
nRBC: 0 % (ref 0.0–0.2)

## 2020-12-17 LAB — TYPE AND SCREEN
ABO/RH(D): B POS
Antibody Screen: NEGATIVE

## 2020-12-17 LAB — GLUCOSE, CAPILLARY
Glucose-Capillary: 122 mg/dL — ABNORMAL HIGH (ref 70–99)
Glucose-Capillary: 136 mg/dL — ABNORMAL HIGH (ref 70–99)
Glucose-Capillary: 91 mg/dL (ref 70–99)

## 2020-12-17 LAB — FERRITIN: Ferritin: 103 ng/mL (ref 11–307)

## 2020-12-17 LAB — LIPASE, BLOOD: Lipase: 33 U/L (ref 11–51)

## 2020-12-17 LAB — RETICULOCYTES
Immature Retic Fract: 17.6 % — ABNORMAL HIGH (ref 2.3–15.9)
RBC.: 2.96 MIL/uL — ABNORMAL LOW (ref 3.87–5.11)
Retic Count, Absolute: 58.6 10*3/uL (ref 19.0–186.0)
Retic Ct Pct: 2 % (ref 0.4–3.1)

## 2020-12-17 LAB — VITAMIN D 25 HYDROXY (VIT D DEFICIENCY, FRACTURES): Vit D, 25-Hydroxy: 28.51 ng/mL — ABNORMAL LOW (ref 30–100)

## 2020-12-17 LAB — LACTIC ACID, PLASMA: Lactic Acid, Venous: 1 mmol/L (ref 0.5–1.9)

## 2020-12-17 LAB — PHOSPHORUS: Phosphorus: 3 mg/dL (ref 2.5–4.6)

## 2020-12-17 LAB — VITAMIN B12: Vitamin B-12: 202 pg/mL (ref 180–914)

## 2020-12-17 LAB — IRON AND TIBC
Iron: 42 ug/dL (ref 28–170)
Saturation Ratios: 15 % (ref 10.4–31.8)
TIBC: 287 ug/dL (ref 250–450)
UIBC: 245 ug/dL

## 2020-12-17 LAB — ECHOCARDIOGRAM COMPLETE
Area-P 1/2: 3.99 cm2
Calc EF: 73.8 %
Height: 63 in
S' Lateral: 2.3 cm
Single Plane A2C EF: 76.8 %
Single Plane A4C EF: 70 %
Weight: 3904 oz

## 2020-12-17 LAB — FOLATE: Folate: 12.5 ng/mL (ref >5.9)

## 2020-12-17 LAB — MAGNESIUM: Magnesium: 2.1 mg/dL (ref 1.7–2.4)

## 2020-12-17 LAB — CK: Total CK: 118 U/L (ref 38–234)

## 2020-12-17 LAB — D-DIMER, QUANTITATIVE: D-Dimer, Quant: 10.03 ug/mL-FEU — ABNORMAL HIGH (ref 0.00–0.50)

## 2020-12-17 LAB — HEMOGLOBIN A1C
Hgb A1c MFr Bld: 5.3 % (ref 4.8–5.6)
Mean Plasma Glucose: 105 mg/dL

## 2020-12-17 MED ORDER — ENOXAPARIN SODIUM 120 MG/0.8ML IJ SOSY
110.0000 mg | PREFILLED_SYRINGE | Freq: Two times a day (BID) | INTRAMUSCULAR | Status: DC
  Filled 2020-12-17: qty 0.74

## 2020-12-17 MED ORDER — ONDANSETRON HCL 4 MG/2ML IJ SOLN
4.0000 mg | Freq: Four times a day (QID) | INTRAMUSCULAR | Status: DC | PRN
  Filled 2020-12-17: qty 2

## 2020-12-17 MED ORDER — CEFAZOLIN SODIUM-DEXTROSE 2-4 GM/100ML-% IV SOLN
2.0000 g | INTRAVENOUS | Status: DC
  Filled 2020-12-17: qty 100

## 2020-12-17 MED ORDER — CHLORHEXIDINE GLUCONATE 4 % EX LIQD
60.0000 mL | Freq: Once | CUTANEOUS | Status: AC
  Administered 2020-12-18: 4 via TOPICAL
  Filled 2020-12-17: qty 60

## 2020-12-17 MED ORDER — PERFLUTREN LIPID MICROSPHERE
1.0000 mL | INTRAVENOUS | Status: AC | PRN
Start: 2020-12-17 — End: 2020-12-17
  Administered 2020-12-17: 2 mL via INTRAVENOUS
  Filled 2020-12-17: qty 10

## 2020-12-17 MED ORDER — ENOXAPARIN SODIUM 60 MG/0.6ML IJ SOSY
55.0000 mg | PREFILLED_SYRINGE | INTRAMUSCULAR | Status: DC
Start: 2020-12-17 — End: 2020-12-18
  Filled 2020-12-17: qty 0.55

## 2020-12-17 MED ORDER — TRANEXAMIC ACID-NACL 1000-0.7 MG/100ML-% IV SOLN
1000.0000 mg | INTRAVENOUS | Status: DC
  Filled 2020-12-17: qty 100

## 2020-12-17 MED ORDER — ACETAMINOPHEN 500 MG PO TABS: 1000.0000 mg | ORAL_TABLET | Freq: Once | ORAL | Status: DC

## 2020-12-17 MED ORDER — POVIDONE-IODINE 10 % EX SWAB: 2.0000 "application " | Freq: Once | CUTANEOUS | Status: DC

## 2020-12-17 NOTE — Consult Note (Signed)
ORTHOPAEDIC CONSULTATION  REQUESTING PHYSICIAN: Rachael Fee, MD  Chief Complaint: Right supracondylar femur periprosthetic fracture  HPI: Helen James is a 65 y.o. female who presents with above mentioned condition s/p syncopal fall at home.  [facility].  She's followed by ID for HIV which is well controlled.    Past Medical History:  Diagnosis Date   DM2 (diabetes mellitus, type 2) (HCC)    HIV disease (HCC) 12/03/2015   HTN (hypertension)    Mixed hyperlipidemia    Morbid obesity with BMI of 40.0-44.9, adult [facility])    Past Surgical History:  Procedure Laterality Date   GASTRIC BYPASS OPEN  2006   HAMMER TOE SURGERY     TOTAL KNEE ARTHROPLASTY Right 2009   Social History   Socioeconomic History   Marital status: Single    Spouse name: Not on file   Number of children: Not on file   Years of education: Not on file   Highest education level: Not on file  Occupational History   Not on file  Tobacco Use   Smoking status: Never   Smokeless tobacco: Never  Substance and Sexual Activity   Alcohol use: No   Drug use: No   Sexual activity: Not Currently  Other Topics Concern   Not on file  Social History Narrative   Not on file   Social Determinants of Health   Financial Resource Strain: Not on file  Food Insecurity: Not on file  Transportation Needs: Not on file  Physical Activity: Not on file  Stress: Not on file  Social Connections: Not on file   Family History  Problem Relation Age of Onset   CAD Mother    Diabetes Other    - negative except otherwise stated in the family history section Allergies  Allergen Reactions   Ramipril Swelling    Facial swelling   Aspirin Tinitus   Feldene [Piroxicam] Other (See Comments)    States causes eyelids to "jump"   Latex Itching   Nsaids Other (See Comments)    Ears ringing   Percocet [Oxycodone-Acetaminophen] Other (See Comments)    Hallucinations per patient   Tylenol  [Acetaminophen] Itching   Prior to Admission medications   Medication Sig Start Date End Date Taking? Authorizing Provider  acyclovir (ZOVIRAX) 400 MG tablet TAKE 1 TABLET TWICE DAILY Patient taking differently: Take 400 mg by mouth 2 (two) times daily. 03/31/20  Yes Comer, Belia Heman, MD  atorvastatin (LIPITOR) 10 MG tablet Take 10 mg by mouth daily.   Yes [provider]  Biotin 5000 MCG TABS Take 5,000 mcg by mouth daily.   Yes [provider]  Cyanocobalamin (VITAMIN B-12 PO) Take 1 tablet by mouth daily.   Yes [provider]  diphenhydrAMINE (BENADRYL) 25 MG tablet Take 1 tablet (25 mg total) by mouth 3 (three) times daily. Take one tablet three times daily for two days Patient taking differently: Take 25 mg by mouth daily as needed for sleep or allergies. 05/07/16  Yes Gerhard Munch, MD  efavirenz-emtricitabine-tenofovir (ATRIPLA) 600-200-300 MG tablet TAKE 1 TABLET BY MOUTH EVERYDAY AT BEDTIME Patient taking differently: Take 1 tablet by mouth See admin instructions. Take 1 tablet by mouth daily at 2pm 11/17/20  Yes Comer, Belia Heman, MD  hydrochlorothiazide (MICROZIDE) 12.5 MG capsule Take 12.5 mg by mouth daily. 10/15/17  Yes [provider]  losartan (COZAAR) 100 MG tablet Take 100 mg by mouth daily. 10/09/18  Yes [provider]  MAGNESIUM PO Take 125 mg by mouth 2 (two) times daily.   Yes [provider]  Multiple Vitamin (MULTIVITAMIN) tablet Take 1 tablet by mouth daily.   Yes [provider]  ursodiol (ACTIGALL) 250 MG tablet Take 250 mg by mouth 2 (two) times daily.   Yes [provider]   DG Chest 1 View  Result Date: 12/16/2020 CLINICAL DATA:  Fall Rt leg pain no chest complaints, best obtainable images due to patient size and uncontrolled pain EXAM: CHEST  1 VIEW COMPARISON:  None. FINDINGS: Slightly prominent cardiac silhouette. Otherwise the heart and mediastinal contours are within normal limits. Aortic  calcification No focal consolidation. No pulmonary edema. No pleural effusion. No pneumothorax. No acute osseous abnormality. Severe degenerative changes of the right shoulder from IMPRESSION: No active disease. Electronically Signed   By: Tish Frederickson M.D.   On: 12/16/2020 20:37   CT KNEE RIGHT WO CONTRAST  Result Date: 12/17/2020 CLINICAL DATA:  Femur fracture, preop EXAM: CT OF THE right KNEE WITHOUT CONTRAST TECHNIQUE: Multidetector CT imaging of the right knee was performed according to the standard protocol. Multiplanar CT image reconstructions were also generated. COMPARISON:  Right knee x-ray 12/16/2020 FINDINGS: Bones/Joint/Cartilage Previous total right knee arthroplasty surgical changes. There is an acute comminuted periprosthetic fracture at the distal diaphysis and metaphysis of the femur with moderate displacement of fracture fragments measuring up to 1.6 cm and medial angulation of the distal fragment. No additional fracture identified. Lipohemarthrosis partially visualized. Ligaments Suboptimally assessed by CT. Muscles and Tendons Limited visualization due to metallic artifacts. Soft tissues Unremarkable. IMPRESSION: 1. Acute comminuted periprosthetic displaced fracture of the distal diaphysis and metaphysis of the femur. 2. Lipohemarthrosis. Electronically Signed   By: Jannifer Hick M.D.   On: 12/17/2020 11:38   DG Knee Complete 4 Views Right  Result Date: 12/16/2020 CLINICAL DATA:  Syncope, right leg pain EXAM: RIGHT KNEE - COMPLETE 4+ VIEW COMPARISON:  None. FINDINGS: Frontal, bilateral oblique, and cross-table lateral views of the right knee are obtained. There is a 3 component right knee arthroplasty identified. A comminuted periprosthetic fracture is seen involving the distal right femoral metadiaphyseal junction, with moderate displacement and ventral angulation at the fracture site. There is diffuse soft tissue swelling. IMPRESSION: 1. Comminuted displaced periprosthetic  distal right femoral fracture. Electronically Signed   By: Sharlet Salina M.D.   On: 12/16/2020 20:28   ECHOCARDIOGRAM COMPLETE  Result Date: 12/17/2020    ECHOCARDIOGRAM REPORT   Patient Name:   Helen James Date of Exam: 12/17/2020 Medical Rec #:  [id number]        Height:       63.0 in Accession #:    [id number]       Weight:       244.0 lb Date of Birth:  1955/04/21       BSA:          2.104 m Patient Age:    64 years         BP:           117/76 mmHg Patient Gender: F                HR:           71 bpm. Exam Location:  Inpatient Procedure: 2D Echo, Cardiac Doppler, Color Doppler and Intracardiac            Opacification Agent Indications:    Syncope R55  History:  Patient has no prior history of Echocardiogram examinations.                 Risk Factors:Hypertension, Diabetes and Dyslipidemia. Preop                 cardiovascular evaluation.  Sonographer:    Leta Jungling RDCS Referring Phys: [id number] ANASTASSIA DOUTOVA IMPRESSIONS  1. Left ventricular ejection fraction, by estimation, is 55 to 60%. The left ventricle has normal function. The left ventricle has no regional wall motion abnormalities. Left ventricular diastolic parameters were normal.  2. Right ventricular systolic function is normal. The right ventricular size is normal. There is normal pulmonary artery systolic pressure.  3. Cannot r/o PFO.  4. The pericardial effusion is posterior to the left ventricle.  5. The mitral valve is normal in structure. No evidence of mitral valve regurgitation. No evidence of mitral stenosis.  6. The aortic valve is tricuspid. There is mild calcification of the aortic valve. Aortic valve regurgitation is trivial. Aortic valve sclerosis is present, with no evidence of aortic valve stenosis.  7. The inferior vena cava is normal in size with greater than 50% respiratory variability, suggesting right atrial pressure of 3 mmHg. FINDINGS  Left Ventricle: Left ventricular ejection fraction, by estimation, is  55 to 60%. The left ventricle has normal function. The left ventricle has no regional wall motion abnormalities. Definity contrast agent was given IV to delineate the left ventricular  endocardial borders. The left ventricular internal cavity size was normal in size. There is no left ventricular hypertrophy. Left ventricular diastolic parameters were normal. Right Ventricle: The right ventricular size is normal. No increase in right ventricular wall thickness. Right ventricular systolic function is normal. There is normal pulmonary artery systolic pressure. The tricuspid regurgitant velocity is 2.54 m/s, and  with an assumed right atrial pressure of 3 mmHg, the estimated right ventricular systolic pressure is 28.8 mmHg. Left Atrium: Left atrial size was normal in size. Right Atrium: Right atrial size was normal in size. Pericardium: Trivial pericardial effusion is present. The pericardial effusion is posterior to the left ventricle. Mitral Valve: The mitral valve is normal in structure. No evidence of mitral valve regurgitation. No evidence of mitral valve stenosis. Tricuspid Valve: The tricuspid valve is normal in structure. Tricuspid valve regurgitation is mild . No evidence of tricuspid stenosis. Aortic Valve: The aortic valve is tricuspid. There is mild calcification of the aortic valve. Aortic valve regurgitation is trivial. Aortic valve sclerosis is present, with no evidence of aortic valve stenosis. Pulmonic Valve: The pulmonic valve was normal in structure. Pulmonic valve regurgitation is not visualized. No evidence of pulmonic stenosis. Aorta: The aortic root is normal in size and structure. Venous: The inferior vena cava is normal in size with greater than 50% respiratory variability, suggesting right atrial pressure of 3 mmHg. IAS/Shunts: The interatrial septum was not well visualized.  LEFT VENTRICLE PLAX 2D LVIDd:         4.00 cm     Diastology LVIDs:         2.30 cm     LV e' medial:    7.83 cm/s LV  PW:         0.80 cm     LV E/e' medial:  11.8 LV IVS:        0.80 cm     LV e' lateral:   9.36 cm/s LVOT diam:     1.90 cm     LV E/e' lateral: 9.9 LV SV:  77 LV SV Index:   36 LVOT Area:     2.84 cm  LV Volumes (MOD) LV vol d, MOD A2C: 89.5 ml LV vol d, MOD A4C: 94.7 ml LV vol s, MOD A2C: 20.8 ml LV vol s, MOD A4C: 28.4 ml LV SV MOD A2C:     68.7 ml LV SV MOD A4C:     94.7 ml LV SV MOD BP:      68.3 ml LEFT ATRIUM             Index LA diam:        2.60 cm 1.24 cm/m LA Vol (A2C):   35.1 ml 16.68 ml/m LA Vol (A4C):   33.8 ml 16.04 ml/m LA Biplane Vol: 31.1 ml 14.78 ml/m  AORTIC VALVE             PULMONIC VALVE LVOT Vmax:   130.00 cm/s PR End Diast Vel: 4.58 msec LVOT Vmean:  90.800 cm/s LVOT VTI:    0.270 m  AORTA Ao Root diam: 2.70 cm Ao Asc diam:  3.00 cm MITRAL VALVE               TRICUSPID VALVE MV Area (PHT): 3.99 cm    TR Peak grad:   25.8 mmHg MV Decel Time: 190 msec    TR Vmax:        254.00 cm/s MV E velocity: 92.40 cm/s MV A velocity: 86.90 cm/s  SHUNTS MV E/A ratio:  1.06        Systemic VTI:  0.27 m                            Systemic Diam: 1.90 cm Charlton Haws MD Electronically signed by Charlton Haws MD Signature Date/Time: 12/17/2020/10:08:46 AM    Final    DG Hip Unilat W or Wo Pelvis 2-3 Views Right  Result Date: 12/16/2020 CLINICAL DATA:  Syncope, right hip pain EXAM: DG HIP (WITH OR WITHOUT PELVIS) 2-3V RIGHT COMPARISON:  None. FINDINGS: Frontal view of the pelvis as well as frontal and cross-table lateral views of the right hip are obtained. Evaluation is limited by technique and body habitus. No acute fracture, subluxation, or dislocation. Joint spaces are well preserved. Sacroiliac joints are normal. IMPRESSION: 1. Unremarkable pelvis and right hip. Electronically Signed   By: Sharlet Salina M.D.   On: 12/16/2020 20:27   DG FEMUR, MIN 2 VIEWS RIGHT  Result Date: 12/17/2020 CLINICAL DATA:  Fall, right leg pain EXAM: RIGHT FEMUR 2 VIEWS COMPARISON:  12/16/2020 FINDINGS:  Unchanged, comminuted, angulated and displaced periprosthetic fracture of the distal right femur status post right knee total arthroplasty. The proximal right femur is intact. Examination is generally somewhat limited by large body habitus. Soft tissues are unremarkable. IMPRESSION: Unchanged, comminuted, angulated and displaced periprosthetic fracture of the distal right femur status post right knee total arthroplasty. The proximal right femur is intact. Electronically Signed   By: Jearld Lesch M.D.   On: 12/17/2020 11:21   VAS US CAROTID  Result Date: 12/17/2020 Carotid Arterial Duplex Study Patient Name:  Helen James  Date of Exam:   12/17/2020 Medical Rec #: [id number]         Accession #:    [id number] Date of Birth: 23-Jan-1956        Patient Gender: F Patient Age:   89 years Exam Location:  [facility] Procedure:      VAS US CAROTID Referring Phys:  ANASTASSIA DOUTOVA --------------------------------------------------------------------------------  Indications:       Syncope. Risk Factors:      Hypertension, Diabetes, no history of smoking. Comparison Study:  No previous exams Performing Technologist: Jody Hill RVT, RDMS  Examination Guidelines: A complete evaluation includes B-mode imaging, spectral Doppler, color Doppler, and power Doppler as needed of all accessible portions of each vessel. Bilateral testing is considered an integral part of a complete examination. Limited examinations for reoccurring indications may be performed as noted.  Right Carotid Findings: +----------+--------+-------+--------+----------------------+------------------+           PSV cm/sEDV    StenosisPlaque Description    Comments                             cm/s                                                    +----------+--------+-------+--------+----------------------+------------------+ CCA Prox  105     11             smooth and            intimal thickening                                   heterogenous                             +----------+--------+-------+--------+----------------------+------------------+ CCA Distal84      14                                   intimal thickening +----------+--------+-------+--------+----------------------+------------------+ ICA Prox  67      15     1-39%   calcific                                 +----------+--------+-------+--------+----------------------+------------------+ ICA Distal60      14                                                      +----------+--------+-------+--------+----------------------+------------------+ ECA       61      8                                                       +----------+--------+-------+--------+----------------------+------------------+ +----------+--------+-------+----------------+-------------------+           PSV cm/sEDV cmsDescribe        Arm Pressure (mmHG) +----------+--------+-------+----------------+-------------------+ [id number]             Multiphasic, WNL                    +----------+--------+-------+----------------+-------------------+ +---------+--------+--+--------+--+---------+ VertebralPSV cm/s51EDV cm/s13Antegrade +---------+--------+--+--------+--+---------+  Left Carotid Findings: +----------+--------+--------+--------+------------------+---------+           PSV cm/sEDV cm/sStenosisPlaque  DescriptionComments  +----------+--------+--------+--------+------------------+---------+ CCA Prox  105     17                                          +----------+--------+--------+--------+------------------+---------+ CCA Distal82      13              heterogenous                +----------+--------+--------+--------+------------------+---------+ ICA Prox  73      17                                Shadowing +----------+--------+--------+--------+------------------+---------+ ICA Distal94      20                                           +----------+--------+--------+--------+------------------+---------+ ECA       78      0                                           +----------+--------+--------+--------+------------------+---------+ +----------+--------+--------+----------------+-------------------+           PSV cm/sEDV cm/sDescribe        Arm Pressure (mmHG) +----------+--------+--------+----------------+-------------------+ Subclavian160             Multiphasic, WNL                    +----------+--------+--------+----------------+-------------------+ +---------+--------+--+--------+--+---------+ VertebralPSV cm/s50EDV cm/s11Antegrade +---------+--------+--+--------+--+---------+   Summary: Right Carotid: Velocities in the right ICA are consistent with a 1-39% stenosis.                The CCA and ECA were near-normal with only minimal wall                thickening or plaque. Left Carotid: The extracranial vessels were near-normal with only minimal wall               thickening or plaque. Vertebrals:  Bilateral vertebral arteries demonstrate antegrade flow. Subclavians: Normal flow hemodynamics were seen in bilateral subclavian              arteries. *See table(s) above for measurements and observations.  Electronically signed by Gerarda Fraction on 12/17/2020 at 10:36:23 AM.    Final    - pertinent xrays, CT, MRI studies were reviewed and independently interpreted  Positive ROS: All other systems have been reviewed and were otherwise negative with the exception of those mentioned in the HPI and as above.  Physical Exam: General: No acute distress Cardiovascular: No pedal edema Respiratory: No cyanosis, no use of accessory musculature GI: No organomegaly, abdomen is soft and non-tender Skin: No lesions in the area of chief complaint Neurologic: Sensation intact distally Psychiatric: Patient is at baseline mood and affect Lymphatic: No axillary or cervical lymphadenopathy  MUSCULOSKELETAL:   - RLE skin intact - normal cap refill, 2+ DP pulse - intact ankle DF and PF  Assessment: Right supracondylar femur periprosthetic fracture  Plan: I have discussed this case with Dr. Blanchie Dessert who has graciously agreed to perform surgical repair.  Appears that the patient is medically optimized for surgery.  Due to  complexity of injury, will need transfer to [facility] for surgery.  Timing of surgery will depend on Dr. Blanchie Dessert.    Thank you for the consult and the opportunity to see Ms. Howerter  N. Glee Arvin, MD [facility] 1:16 PM

## 2020-12-17 NOTE — Plan of Care (Signed)
  Problem: Activity: Goal: Ability to return to normal activity level will improve to the fullest extent possible by discharge Outcome: Progressing   Problem: Education: Goal: Knowledge of medication regimen will be met for pain relief regimen by discharge Outcome: Progressing Goal: Understanding of ways to prevent infection will improve by discharge Outcome: Progressing   Problem: Coping: Goal: Ability to verbalize feelings will improve by discharge Outcome: Progressing Goal: Family members realistic understanding of the patients condition will improve by discharge Outcome: Progressing   Problem: Fluid Volume: Goal: Maintenance of adequate hydration will improve by discharge Outcome: Progressing   Problem: Medication: Goal: Compliance with prescribed medication regimen will improve by discharge Outcome: Progressing   Problem: Physical Regulation: Goal: Hemodynamic stability will return to baseline for the patient by discharge Outcome: Progressing Goal: Diagnostic test results will improve Outcome: Progressing Goal: Will remain free from infection Outcome: Progressing   Problem: Respiratory: Goal: Ability to maintain adequate oxygenation and ventilation will improve by discharge Outcome: Progressing   Problem: Role Relationship: Goal: Ability to identify and utilize available support systems will improve by discharge Outcome: Progressing   Problem: Pain Management: Goal: Satisfaction with pain management regimen will be met by discharge Outcome: Progressing

## 2020-12-17 NOTE — Progress Notes (Signed)
Patient's injury was discussed with Dr. Blanchie Dessert and he has agreed to perform surgical repair pending medical and cardiac optimization.  I will speak with patient about treatment plan today.

## 2020-12-17 NOTE — ED Notes (Signed)
Per lab, need to recollect a purple top

## 2020-12-17 NOTE — Progress Notes (Signed)
    Echo reviewed with Dr. Jens Som.  EF is normal with no wall motion abnormalities and normal diastolic function.  Although the note mentions cannot rule out PFO, there is no further work-up that needs to be done for this.  She is at acceptable risk for the planned procedure without further cardiac evaluation.  Theodore Demark, PA-C 12/17/2020 10:58 AM

## 2020-12-17 NOTE — Progress Notes (Addendum)
PROGRESS NOTE    Helen James  TML:[id number] DOB: 05/28/1955 DOA: 12/16/2020 PCP: System, Provider Not In    Chief Complaint  Patient presents with   Leg Pain   Loss of Consciousness    Brief Narrative:  Helen James is a 65 yo w/PMHx of morbid obesity, HIV, DM, H TN who presents w/distal femur fracture in the setting of vasovagal syncope.  Subjective: Overnight, no acute events. Pain well controlled. Comfortable with plan.  Assessment & Plan:   Principal Problem:   Syncope Active Problems:   HIV disease (HCC)   H/O gastric bypass   HTN (hypertension)   HSV infection   Anemia   Syncope, vasovagal   Femoral distal fracture (HCC)  Distal femoral fracture  Comminuted displaced periprosthetic distal right femoral fracture. - given fracture of prior knee arthroplasty, will plan for transfer to [facility] for eval/surgery - Orthopedics following - pain control - DVT ppx: lovenox 40 q12h given obesity  Vasovagal Syncope  Occurred in the setting of nausea - Cardiology evaluated: TTE WNL, no further workup - antiemetics - unlikely to be PE, if HR or O2 status changes, can consider eval  HTN (hypertension)  Home regimen: losartan, HCTZ - Bps currently well controlled - resume as needed   HSV infection -stable continue home medication   HIV disease (HCC) -check HIV viral load and CD4 count continue home medication   Anemia -chronic    DVT prophylaxis: lovenox Code Status: full  Disposition:   Status is: Inpatient  Remains inpatient appropriate because: she needs surgery   Consultants:  Orthopedics  Procedures: n/a  Antimicrobials: n/a    Objective: Vitals:   12/17/20 1130 12/17/20 1145 12/17/20 1200 12/17/20 1230  BP: 121/67 115/60 122/67 (!) 144/74  Pulse: 66 65 66 66  Resp: 14 13 16 14   Temp:      TempSrc:      SpO2: 100% 100% 100% 100%  Weight:      Height:        Intake/Output Summary (Last 24 hours) at 12/17/2020 1347 Last  data filed at 12/17/2020 0600 Gross per 24 hour  Intake --  Output 750 ml  Net -750 ml   Filed Weights   12/16/20 1820  Weight: 110.7 kg    Examination:  General exam: Appears calm and comfortable  Respiratory system: Clear to auscultation. Respiratory effort normal. Cardiovascular system: S1 & S2 heard, RRR. No JVD, murmurs, rubs, gallops or clicks. No pedal edema. Gastrointestinal system: Abdomen is nondistended, soft and nontender. No organomegaly or masses felt. Normal bowel sounds heard. Central nervous system: Alert and oriented. No focal neurological deficits. Extremities: deformity of R leg Skin: No rashes, lesions or ulcers Neuro: sensation intact Psychiatry: Judgement and insight appear normal. Mood & affect appropriate.     Data Reviewed: I have personally reviewed following labs and imaging studies  CBC: Recent Labs  Lab 12/16/20 2143  WBC 5.6  NEUTROABS 3.1  HGB 10.8*  HCT 33.0*  MCV 100.6*  PLT 191    Basic Metabolic Panel: Recent Labs  Lab 12/16/20 2143 12/16/20 2330 12/17/20 0500  NA 140  --  139  K 3.9  --  4.1  CL 115*  --  113*  CO2 18*  --  18*  GLUCOSE 157*  --  123*  BUN 20  --  19  CREATININE 0.97  --  0.84  CALCIUM 8.4*  --  8.4*  MG  --  2.1  --   PHOS  --  3.0  --     GFR: Estimated Creatinine Clearance: 78.7 mL/min (by C-G formula based on SCr of 0.84 mg/dL).  Liver Function Tests: Recent Labs  Lab 12/16/20 2143  AST 26  ALT 19  ALKPHOS 162*  BILITOT 0.2*  PROT 6.2*  ALBUMIN 3.1*    CBG: Recent Labs  Lab 12/16/20 2359 12/17/20 0423 12/17/20 0917 12/17/20 1137  GLUCAP 129* 138* 116* 103*     Recent Results (from the past 240 hour(s))  Resp Panel by RT-PCR (Flu A&B, Covid) Nasopharyngeal Swab     Status: None   Collection Time: 12/16/20  8:43 PM   Specimen: Nasopharyngeal Swab; Nasopharyngeal(NP) swabs in vial transport medium  Result Value Ref Range Status   SARS Coronavirus 2 by RT PCR NEGATIVE NEGATIVE  Final    Comment: (NOTE) SARS-CoV-2 target nucleic acids are NOT DETECTED.  The SARS-CoV-2 RNA is generally detectable in upper respiratory specimens during the acute phase of infection. The lowest concentration of SARS-CoV-2 viral copies this assay can detect is 138 copies/mL. A negative result does not preclude SARS-Cov-2 infection and should not be used as the sole basis for treatment or other patient management decisions. A negative result may occur with  improper specimen collection/handling, submission of specimen other than nasopharyngeal swab, presence of viral mutation(s) within the areas targeted by this assay, and inadequate number of viral copies(<138 copies/mL). A negative result must be combined with clinical observations, patient history, and epidemiological information. The expected result is Negative.  Fact Sheet for Patients:  BloggerCourse.com  Fact Sheet for Healthcare Providers:  SeriousBroker.it  This test is no t yet approved or cleared by the [place] FDA and  has been authorized for detection and/or diagnosis of SARS-CoV-2 by FDA under an Emergency Use Authorization (EUA). This EUA will remain  in effect (meaning this test can be used) for the duration of the COVID-19 declaration under Section 564(b)(1) of the Act, 21 U.S.C.section 360bbb-3(b)(1), unless the authorization is terminated  or revoked sooner.       Influenza A by PCR NEGATIVE NEGATIVE Final   Influenza B by PCR NEGATIVE NEGATIVE Final    Comment: (NOTE) The Xpert Xpress SARS-CoV-2/FLU/RSV plus assay is intended as an aid in the diagnosis of influenza from Nasopharyngeal swab specimens and should not be used as a sole basis for treatment. Nasal washings and aspirates are unacceptable for Xpert Xpress SARS-CoV-2/FLU/RSV testing.  Fact Sheet for Patients: BloggerCourse.com  Fact Sheet for Healthcare  Providers: SeriousBroker.it  This test is not yet approved or cleared by the [place] FDA and has been authorized for detection and/or diagnosis of SARS-CoV-2 by FDA under an Emergency Use Authorization (EUA). This EUA will remain in effect (meaning this test can be used) for the duration of the COVID-19 declaration under Section 564(b)(1) of the Act, 21 U.S.C. section 360bbb-3(b)(1), unless the authorization is terminated or revoked.  Performed at [facility], [address], [place], [place] [address]          Radiology Studies: DG Chest 1 View  Result Date: 12/16/2020 CLINICAL DATA:  Fall Rt leg pain no chest complaints, best obtainable images due to patient size and uncontrolled pain EXAM: CHEST  1 VIEW COMPARISON:  None. FINDINGS: Slightly prominent cardiac silhouette. Otherwise the heart and mediastinal contours are within normal limits. Aortic calcification No focal consolidation. No pulmonary edema. No pleural effusion. No pneumothorax. No acute osseous abnormality. Severe degenerative changes of the right shoulder from IMPRESSION: No active disease.  Electronically Signed   By: Tish Frederickson M.D.   On: 12/16/2020 20:37   CT KNEE RIGHT WO CONTRAST  Result Date: 12/17/2020 CLINICAL DATA:  Femur fracture, preop EXAM: CT OF THE right KNEE WITHOUT CONTRAST TECHNIQUE: Multidetector CT imaging of the right knee was performed according to the standard protocol. Multiplanar CT image reconstructions were also generated. COMPARISON:  Right knee x-ray 12/16/2020 FINDINGS: Bones/Joint/Cartilage Previous total right knee arthroplasty surgical changes. There is an acute comminuted periprosthetic fracture at the distal diaphysis and metaphysis of the femur with moderate displacement of fracture fragments measuring up to 1.6 cm and medial angulation of the distal fragment. No additional fracture identified. Lipohemarthrosis partially visualized.  Ligaments Suboptimally assessed by CT. Muscles and Tendons Limited visualization due to metallic artifacts. Soft tissues Unremarkable. IMPRESSION: 1. Acute comminuted periprosthetic displaced fracture of the distal diaphysis and metaphysis of the femur. 2. Lipohemarthrosis. Electronically Signed   By: Jannifer Hick M.D.   On: 12/17/2020 11:38   DG Knee Complete 4 Views Right  Result Date: 12/16/2020 CLINICAL DATA:  Syncope, right leg pain EXAM: RIGHT KNEE - COMPLETE 4+ VIEW COMPARISON:  None. FINDINGS: Frontal, bilateral oblique, and cross-table lateral views of the right knee are obtained. There is a 3 component right knee arthroplasty identified. A comminuted periprosthetic fracture is seen involving the distal right femoral metadiaphyseal junction, with moderate displacement and ventral angulation at the fracture site. There is diffuse soft tissue swelling. IMPRESSION: 1. Comminuted displaced periprosthetic distal right femoral fracture. Electronically Signed   By: Sharlet Salina M.D.   On: 12/16/2020 20:28   ECHOCARDIOGRAM COMPLETE  Result Date: 12/17/2020    ECHOCARDIOGRAM REPORT   Patient Name:   Helen James Date of Exam: 12/17/2020 Medical Rec #:  [id number]        Height:       63.0 in Accession #:    [id number]       Weight:       244.0 lb Date of Birth:  [birth year]       BSA:          2.104 m Patient Age:    64 years         BP:           117/76 mmHg Patient Gender: F                HR:           71 bpm. Exam Location:  Inpatient Procedure: 2D Echo, Cardiac Doppler, Color Doppler and Intracardiac            Opacification Agent Indications:    Syncope R55  History:        Patient has no prior history of Echocardiogram examinations.                 Risk Factors:Hypertension, Diabetes and Dyslipidemia. Preop                 cardiovascular evaluation.  Sonographer:    Leta Jungling RDCS Referring Phys: [id number] ANASTASSIA DOUTOVA IMPRESSIONS  1. Left ventricular ejection fraction, by  estimation, is 55 to 60%. The left ventricle has normal function. The left ventricle has no regional wall motion abnormalities. Left ventricular diastolic parameters were normal.  2. Right ventricular systolic function is normal. The right ventricular size is normal. There is normal pulmonary artery systolic pressure.  3. Cannot r/o PFO.  4. The pericardial effusion is posterior to the left ventricle.  5. The mitral valve  is normal in structure. No evidence of mitral valve regurgitation. No evidence of mitral stenosis.  6. The aortic valve is tricuspid. There is mild calcification of the aortic valve. Aortic valve regurgitation is trivial. Aortic valve sclerosis is present, with no evidence of aortic valve stenosis.  7. The inferior vena cava is normal in size with greater than 50% respiratory variability, suggesting right atrial pressure of 3 mmHg. FINDINGS  Left Ventricle: Left ventricular ejection fraction, by estimation, is 55 to 60%. The left ventricle has normal function. The left ventricle has no regional wall motion abnormalities. Definity contrast agent was given IV to delineate the left ventricular  endocardial borders. The left ventricular internal cavity size was normal in size. There is no left ventricular hypertrophy. Left ventricular diastolic parameters were normal. Right Ventricle: The right ventricular size is normal. No increase in right ventricular wall thickness. Right ventricular systolic function is normal. There is normal pulmonary artery systolic pressure. The tricuspid regurgitant velocity is 2.54 m/s, and  with an assumed right atrial pressure of 3 mmHg, the estimated right ventricular systolic pressure is 28.8 mmHg. Left Atrium: Left atrial size was normal in size. Right Atrium: Right atrial size was normal in size. Pericardium: Trivial pericardial effusion is present. The pericardial effusion is posterior to the left ventricle. Mitral Valve: The mitral valve is normal in structure. No  evidence of mitral valve regurgitation. No evidence of mitral valve stenosis. Tricuspid Valve: The tricuspid valve is normal in structure. Tricuspid valve regurgitation is mild . No evidence of tricuspid stenosis. Aortic Valve: The aortic valve is tricuspid. There is mild calcification of the aortic valve. Aortic valve regurgitation is trivial. Aortic valve sclerosis is present, with no evidence of aortic valve stenosis. Pulmonic Valve: The pulmonic valve was normal in structure. Pulmonic valve regurgitation is not visualized. No evidence of pulmonic stenosis. Aorta: The aortic root is normal in size and structure. Venous: The inferior vena cava is normal in size with greater than 50% respiratory variability, suggesting right atrial pressure of 3 mmHg. IAS/Shunts: The interatrial septum was not well visualized.  LEFT VENTRICLE PLAX 2D LVIDd:         4.00 cm     Diastology LVIDs:         2.30 cm     LV e' medial:    7.83 cm/s LV PW:         0.80 cm     LV E/e' medial:  11.8 LV IVS:        0.80 cm     LV e' lateral:   9.36 cm/s LVOT diam:     1.90 cm     LV E/e' lateral: 9.9 LV SV:         77 LV SV Index:   36 LVOT Area:     2.84 cm  LV Volumes (MOD) LV vol d, MOD A2C: 89.5 ml LV vol d, MOD A4C: 94.7 ml LV vol s, MOD A2C: 20.8 ml LV vol s, MOD A4C: 28.4 ml LV SV MOD A2C:     68.7 ml LV SV MOD A4C:     94.7 ml LV SV MOD BP:      68.3 ml LEFT ATRIUM             Index LA diam:        2.60 cm 1.24 cm/m LA Vol (A2C):   35.1 ml 16.68 ml/m LA Vol (A4C):   33.8 ml 16.04 ml/m LA Biplane Vol: 31.1 ml 14.78  ml/m  AORTIC VALVE             PULMONIC VALVE LVOT Vmax:   130.00 cm/s PR End Diast Vel: 4.58 msec LVOT Vmean:  90.800 cm/s LVOT VTI:    0.270 m  AORTA Ao Root diam: 2.70 cm Ao Asc diam:  3.00 cm MITRAL VALVE               TRICUSPID VALVE MV Area (PHT): 3.99 cm    TR Peak grad:   25.8 mmHg MV Decel Time: 190 msec    TR Vmax:        254.00 cm/s MV E velocity: 92.40 cm/s MV A velocity: 86.90 cm/s  SHUNTS MV E/A ratio:   1.06        Systemic VTI:  0.27 m                            Systemic Diam: 1.90 cm Charlton Haws MD Electronically signed by Charlton Haws MD Signature Date/Time: 12/17/2020/10:08:46 AM    Final    DG Hip Unilat W or Wo Pelvis 2-3 Views Right  Result Date: 12/16/2020 CLINICAL DATA:  Syncope, right hip pain EXAM: DG HIP (WITH OR WITHOUT PELVIS) 2-3V RIGHT COMPARISON:  None. FINDINGS: Frontal view of the pelvis as well as frontal and cross-table lateral views of the right hip are obtained. Evaluation is limited by technique and body habitus. No acute fracture, subluxation, or dislocation. Joint spaces are well preserved. Sacroiliac joints are normal. IMPRESSION: 1. Unremarkable pelvis and right hip. Electronically Signed   By: Sharlet Salina M.D.   On: 12/16/2020 20:27   DG FEMUR, MIN 2 VIEWS RIGHT  Result Date: 12/17/2020 CLINICAL DATA:  Fall, right leg pain EXAM: RIGHT FEMUR 2 VIEWS COMPARISON:  12/16/2020 FINDINGS: Unchanged, comminuted, angulated and displaced periprosthetic fracture of the distal right femur status post right knee total arthroplasty. The proximal right femur is intact. Examination is generally somewhat limited by large body habitus. Soft tissues are unremarkable. IMPRESSION: Unchanged, comminuted, angulated and displaced periprosthetic fracture of the distal right femur status post right knee total arthroplasty. The proximal right femur is intact. Electronically Signed   By: Jearld Lesch M.D.   On: 12/17/2020 11:21   VAS US CAROTID  Result Date: 12/17/2020 Carotid Arterial Duplex Study Patient Name:  Helen James  Date of Exam:   12/17/2020 Medical Rec #: [id number]         Accession #:    [id number] Date of Birth: 1955-03-16        Patient Gender: F Patient Age:   86 years Exam Location:  [facility] Procedure:      VAS US CAROTID Referring Phys: Jonny Ruiz DOUTOVA --------------------------------------------------------------------------------  Indications:        Syncope. Risk Factors:      Hypertension, Diabetes, no history of smoking. Comparison Study:  No previous exams Performing Technologist: Jody Hill RVT, RDMS  Examination Guidelines: A complete evaluation includes B-mode imaging, spectral Doppler, color Doppler, and power Doppler as needed of all accessible portions of each vessel. Bilateral testing is considered an integral part of a complete examination. Limited examinations for reoccurring indications may be performed as noted.  Right Carotid Findings: +----------+--------+-------+--------+----------------------+------------------+           PSV cm/sEDV    StenosisPlaque Description    Comments  cm/s                                                    +----------+--------+-------+--------+----------------------+------------------+ CCA Prox  105     11             smooth and            intimal thickening                                  heterogenous                             +----------+--------+-------+--------+----------------------+------------------+ CCA Distal84      14                                   intimal thickening +----------+--------+-------+--------+----------------------+------------------+ ICA Prox  67      15     1-39%   calcific                                 +----------+--------+-------+--------+----------------------+------------------+ ICA Distal60      14                                                      +----------+--------+-------+--------+----------------------+------------------+ ECA       61      8                                                       +----------+--------+-------+--------+----------------------+------------------+ +----------+--------+-------+----------------+-------------------+           PSV cm/sEDV cmsDescribe        Arm Pressure (mmHG) +----------+--------+-------+----------------+-------------------+ [id number]              Multiphasic, WNL                    +----------+--------+-------+----------------+-------------------+ +---------+--------+--+--------+--+---------+ VertebralPSV cm/s51EDV cm/s13Antegrade +---------+--------+--+--------+--+---------+  Left Carotid Findings: +----------+--------+--------+--------+------------------+---------+           PSV cm/sEDV cm/sStenosisPlaque DescriptionComments  +----------+--------+--------+--------+------------------+---------+ CCA Prox  105     17                                          +----------+--------+--------+--------+------------------+---------+ CCA Distal82      13              heterogenous                +----------+--------+--------+--------+------------------+---------+ ICA Prox  73      17                                Shadowing +----------+--------+--------+--------+------------------+---------+ ICA Distal94  20                                          +----------+--------+--------+--------+------------------+---------+ ECA       78      0                                           +----------+--------+--------+--------+------------------+---------+ +----------+--------+--------+----------------+-------------------+           PSV cm/sEDV cm/sDescribe        Arm Pressure (mmHG) +----------+--------+--------+----------------+-------------------+ Subclavian160             Multiphasic, WNL                    +----------+--------+--------+----------------+-------------------+ +---------+--------+--+--------+--+---------+ VertebralPSV cm/s50EDV cm/s11Antegrade +---------+--------+--+--------+--+---------+   Summary: Right Carotid: Velocities in the right ICA are consistent with a 1-39% stenosis.                The CCA and ECA were near-normal with only minimal wall                thickening or plaque. Left Carotid: The extracranial vessels were near-normal with only minimal wall                thickening or plaque. Vertebrals:  Bilateral vertebral arteries demonstrate antegrade flow. Subclavians: Normal flow hemodynamics were seen in bilateral subclavian              arteries. *See table(s) above for measurements and observations.  Electronically signed by Gerarda Fraction on 12/17/2020 at 10:36:23 AM.    Final         Scheduled Meds:  acyclovir  400 mg Oral BID   atorvastatin  10 mg Oral Daily   efavirenz-emtricitabine-tenofovir  1 tablet Oral Daily   insulin aspart  0-9 Units Subcutaneous Q4H   Continuous Infusions:  sodium chloride 100 mL/hr at 12/17/20 0616   methocarbamol (ROBAXIN) IV       LOS: 1 day  I spent additional 30 minutes in management, planning, and care coordination.  Rachael Fee, MD [facility]   To contact the attending provider between 7A-7P or the covering provider during after hours 7P-7A, please log into the web site [email or web address] and access using universal [place] password for that web site. If you do not have the password, please call the hospital operator.  12/17/2020, 1:47 PM

## 2020-12-17 NOTE — Progress Notes (Signed)
Carotid duplex has been completed.  Results can be found under chart review under CV PROC. 12/17/2020 9:34 AM Tiler Brandis RVT, RDMS

## 2020-12-17 NOTE — Anesthesia Preprocedure Evaluation (Addendum)
Anesthesia Evaluation  Patient identified by MRN, date of birth, ID band Patient awake    Reviewed: Allergy & Precautions, H&P , NPO status , Patient's Chart, lab work & pertinent test results  Airway Mallampati: II  TM Distance: >3 FB Neck ROM: Full    Dental no notable dental hx. (+) Partial Lower, Partial Upper, Dental Advisory Given   Pulmonary neg pulmonary ROS,    Pulmonary exam normal breath sounds clear to auscultation       Cardiovascular Exercise Tolerance: Good hypertension, Pt. on medications  Rhythm:Regular Rate:Normal     Neuro/Psych negative neurological ROS  negative psych ROS   GI/Hepatic negative GI ROS, Neg liver ROS,   Endo/Other  diabetes, Type 2, Oral Hypoglycemic AgentsMorbid obesity  Renal/GU negative Renal ROS  negative genitourinary   Musculoskeletal   Abdominal   Peds  Hematology  (+) Blood dyscrasia, anemia , HIV,   Anesthesia Other Findings   Reproductive/Obstetrics negative OB ROS                            Anesthesia Physical Anesthesia Plan  ASA: 3  Anesthesia Plan: General   Post-op Pain Management: Tylenol PO (pre-op) and Ketamine IV   Induction: Intravenous  PONV Risk Score and Plan: 4 or greater and Ondansetron, Dexamethasone and Midazolam  Airway Management Planned: Oral ETT  Additional Equipment:   Intra-op Plan:   Post-operative Plan: Extubation in OR  Informed Consent: I have reviewed the patients History and Physical, chart, labs and discussed the procedure including the risks, benefits and alternatives for the proposed anesthesia with the patient or authorized representative who has indicated his/her understanding and acceptance.     Dental advisory given  Plan Discussed with: CRNA  Anesthesia Plan Comments:        Anesthesia Quick Evaluation

## 2020-12-17 NOTE — Progress Notes (Signed)
Dr. Blanchie Dessert to perform surgery tomorrow.  Patient may have a diet today.  NPO after midnight.

## 2020-12-17 NOTE — Progress Notes (Signed)
  Echocardiogram 2D Echocardiogram has been performed.  Leta Jungling M 12/17/2020, 10:04 AM

## 2020-12-17 NOTE — ED Notes (Signed)
Patient transported to CT 

## 2020-12-17 NOTE — Consult Note (Addendum)
Cardiology Consultation:   Patient ID: LAKIA GRITTON MRN: [id number]; DOB: 03/03/1955  Admit date: 12/16/2020 Date of Consult: 12/17/2020  PCP:  System, Provider Not In   [facility] Providers Cardiologist:  None new  Patient Profile:   EMMAROSE KLINKE is a 65 y.o. female with a hx of DM2, HTN, HLD, HIV, morbid obesity, gastric bypass 2006 with a weight loss of >100 pounds, R TKR uses a rollator, who is being seen 12/17/2020 for the evaluation of syncope at the request of Dr Adela Glimpse.  History of Present Illness:   Ms. Corvin was in her usual state of health yesterday.  She ate breakfast as usual, but did not eat any lunch.  During the early afternoon, they were preparing food and had their Thanksgiving dinner at approximately 4:00.  After dinner, she went to sit on the couch.  She admits that she ate more than usual, but felt fine.  After she sat on the couch, she felt nauseated.  She felt she needed to go to the bathroom to throw up.  She got up and started towards the bathroom with her Rollator.  She felt fine when she first stood up but then started walking and felt lightheaded.  The lightheaded feeling got worse.  When she woke up, EMS was there.  According to her son who is in the room with her now, one of her daughters did a few chest compressions and another 1 did mouth-mouth.  Ms. Peddie says she was aware of someone blowing in her mouth but was not really awake yet.  When she finally came to, EMS was present.  Nothing like this has never happened before.  She has never had chest pain.    She did not have any palpitations before she passed out and has never had any.  She denies dyspnea on exertion.  She ambulates around the house with a Rollator, to help her carry things from room to room such as laundry.  When she leaves the home, she has to go to down 5 steps.  She will generally use an electric cart when she gets where she is going.  She admits that she does not  walk very much.  She does not wake in the night short of breath.  She states that the most strenuous thing that she does is go up to 5 steps to get into her home, but denies dyspnea with this.  Occasionally, she gets lower extremity edema in her left leg, which needs a knee replacement.  This is daytime only.  Otherwise, no edema.  She admits that she over ate.  Her gastric bypass surgery was in 2006, she said it was open and they decrease the size of her stomach to the size of an egg.  She weighed 355 pounds at the time and has lost over 100 pounds.  However, over the last couple of years, her weight has started creeping back up.   Past Medical History:  Diagnosis Date   DM2 (diabetes mellitus, type 2) (HCC)    HIV disease (HCC) 12/03/2015   HTN (hypertension)    Mixed hyperlipidemia    Morbid obesity with BMI of 40.0-44.9, adult [facility])     Past Surgical History:  Procedure Laterality Date   GASTRIC BYPASS OPEN  2006   HAMMER TOE SURGERY     TOTAL KNEE ARTHROPLASTY Right 2009     Home Medications:  Prior to Admission medications   Medication Sig Start Date End Date Taking?  Authorizing Provider  acyclovir (ZOVIRAX) 400 MG tablet TAKE 1 TABLET TWICE DAILY Patient taking differently: Take 400 mg by mouth 2 (two) times daily. 03/31/20  Yes Comer, Belia Heman, MD  atorvastatin (LIPITOR) 10 MG tablet Take 10 mg by mouth daily.   Yes [provider]  Biotin 5000 MCG TABS Take 5,000 mcg by mouth daily.   Yes [provider]  Cyanocobalamin (VITAMIN B-12 PO) Take 1 tablet by mouth daily.   Yes [provider]  diphenhydrAMINE (BENADRYL) 25 MG tablet Take 1 tablet (25 mg total) by mouth 3 (three) times daily. Take one tablet three times daily for two days Patient taking differently: Take 25 mg by mouth daily as needed for sleep or allergies. 05/07/16  Yes Gerhard Munch, MD  efavirenz-emtricitabine-tenofovir (ATRIPLA) 600-200-300 MG tablet TAKE 1 TABLET BY MOUTH EVERYDAY  AT BEDTIME Patient taking differently: Take 1 tablet by mouth See admin instructions. Take 1 tablet by mouth daily at 2pm 11/17/20  Yes Comer, Belia Heman, MD  hydrochlorothiazide (MICROZIDE) 12.5 MG capsule Take 12.5 mg by mouth daily. 10/15/17  Yes [provider]  losartan (COZAAR) 100 MG tablet Take 100 mg by mouth daily. 10/09/18  Yes [provider]  MAGNESIUM PO Take 125 mg by mouth 2 (two) times daily.   Yes [provider]  Multiple Vitamin (MULTIVITAMIN) tablet Take 1 tablet by mouth daily.   Yes [provider]  ursodiol (ACTIGALL) 250 MG tablet Take 250 mg by mouth 2 (two) times daily.   Yes [provider]    Inpatient Medications: Scheduled Meds:  acyclovir  400 mg Oral BID   atorvastatin  10 mg Oral Daily   efavirenz-emtricitabine-tenofovir  1 tablet Oral Daily   insulin aspart  0-9 Units Subcutaneous Q4H   Continuous Infusions:  sodium chloride 100 mL/hr at 12/17/20 0616   methocarbamol (ROBAXIN) IV     PRN Meds: methocarbamol **OR** methocarbamol (ROBAXIN) IV, morphine injection  Allergies:    Allergies  Allergen Reactions   Ramipril Swelling    Facial swelling   Aspirin Tinitus   Feldene [Piroxicam] Other (See Comments)    States causes eyelids to "jump"   Latex Itching   Nsaids Other (See Comments)    Ears ringing   Percocet [Oxycodone-Acetaminophen] Other (See Comments)    Hallucinations per patient   Tylenol [Acetaminophen] Itching    Social History:   Social History   Socioeconomic History   Marital status: Single    Spouse name: Not on file   Number of children: Not on file   Years of education: Not on file   Highest education level: Not on file  Occupational History   Not on file  Tobacco Use   Smoking status: Never   Smokeless tobacco: Never  Substance and Sexual Activity   Alcohol use: No   Drug use: No   Sexual activity: Not Currently  Other Topics Concern   Not on file  Social History  Narrative   Not on file   Social Determinants of Health   Financial Resource Strain: Not on file  Food Insecurity: Not on file  Transportation Needs: Not on file  Physical Activity: Not on file  Stress: Not on file  Social Connections: Not on file  Intimate Partner Violence: Not on file    Family History:   Family History  Problem Relation Age of Onset   CAD Mother    Diabetes Other      ROS:  Please see the history  of present illness.  All other ROS reviewed and negative.     Physical Exam/Data:   Vitals:   12/17/20 0545 12/17/20 0600 12/17/20 0630 12/17/20 0645  BP: 132/72 138/72 112/63 129/65  Pulse: 76 80 72 74  Resp: Temp:      TempSrc:      SpO2: 98% 99% 95% 97%  Weight:      Height:        Intake/Output Summary (Last 24 hours) at 12/17/2020 0823 Last data filed at 12/17/2020 0600 Gross per 24 hour  Intake --  Output 750 ml  Net -750 ml   Last 3 Weights 12/16/2020 11/01/2020 11/11/2019  Weight (lbs) 244 lb 245 lb 237 lb  Weight (kg) 110.678 kg 111.131 kg 107.502 kg     Body mass index is 43.22 kg/m.  General:  Well nourished, well developed, in no acute distress HEENT: normal for age Neck: no JVD seen, but difficult to assess secondary to body habitus Vascular: No carotid bruits; Distal pulses 2+ bilaterally Cardiac:  normal S1, S2; RRR; possible soft murmur  Lungs:  clear to auscultation bilaterally, no wheezing, rhonchi or rales  Abd: soft, nontender, no hepatomegaly  Ext: no edema Musculoskeletal:  BUE strength normal and equal, because of RLE fracture, lower extremity strength not tested Skin: warm and dry  Neuro:  CNs 2-12 intact, no focal abnormalities noted Psych:  Normal affect   EKG:  The EKG was personally reviewed and demonstrates: Sinus rhythm, heart rate 69, no acute ischemic changes and normal intervals. Telemetry:  Telemetry was personally reviewed and demonstrates: Sinus rhythm  Relevant CV Studies:  ECHO:  Ordered  Laboratory Data:  High Sensitivity Troponin:   Recent Labs  Lab 12/16/20 2330  TROPONINIHS 6     Chemistry Recent Labs  Lab 12/16/20 2143 12/16/20 2330 12/17/20 0500  NA 140  --  139  K 3.9  --  4.1  CL 115*  --  113*  CO2 18*  --  18*  GLUCOSE 157*  --  123*  BUN 20  --  19  CREATININE 0.97  --  0.84  CALCIUM 8.4*  --  8.4*  MG  --  2.1  --   GFRNONAA >60  --  >60  ANIONGAP 7  --  8    Recent Labs  Lab 12/16/20 2143  PROT 6.2*  ALBUMIN 3.1*  AST 26  ALT 19  ALKPHOS 162*  BILITOT 0.2*   Lipids No results for input(s): CHOL, TRIG, HDL, LABVLDL, LDLCALC, CHOLHDL in the last 168 hours.  Hematology Recent Labs  Lab 12/16/20 2143  WBC 5.6  RBC 3.28*  HGB 10.8*  HCT 33.0*  MCV 100.6*  MCH 32.9  MCHC 32.7  RDW 13.0  PLT 191   Thyroid No results for input(s): TSH, FREET4 in the last 168 hours.  BNPNo results for input(s): BNP, PROBNP in the last 168 hours.  DDimer No results for input(s): DDIMER in the last 168 hours.   Radiology/Studies:  DG Chest 1 View  Result Date: 12/16/2020 CLINICAL DATA:  Fall Rt leg pain no chest complaints, best obtainable images due to patient size and uncontrolled pain EXAM: CHEST  1 VIEW COMPARISON:  None. FINDINGS: Slightly prominent cardiac silhouette. Otherwise the heart and mediastinal contours are within normal limits. Aortic calcification No focal consolidation. No pulmonary edema. No pleural effusion. No pneumothorax. No acute osseous abnormality. Severe degenerative changes of the right shoulder from IMPRESSION: No active disease.  Electronically Signed   By: Tish Frederickson M.D.   On: 12/16/2020 20:37   DG Knee Complete 4 Views Right  Result Date: 12/16/2020 CLINICAL DATA:  Syncope, right leg pain EXAM: RIGHT KNEE - COMPLETE 4+ VIEW COMPARISON:  None. FINDINGS: Frontal, bilateral oblique, and cross-table lateral views of the right knee are obtained. There is a 3 component right knee arthroplasty identified. A  comminuted periprosthetic fracture is seen involving the distal right femoral metadiaphyseal junction, with moderate displacement and ventral angulation at the fracture site. There is diffuse soft tissue swelling. IMPRESSION: 1. Comminuted displaced periprosthetic distal right femoral fracture. Electronically Signed   By: Sharlet Salina M.D.   On: 12/16/2020 20:28   DG Hip Unilat W or Wo Pelvis 2-3 Views Right  Result Date: 12/16/2020 CLINICAL DATA:  Syncope, right hip pain EXAM: DG HIP (WITH OR WITHOUT PELVIS) 2-3V RIGHT COMPARISON:  None. FINDINGS: Frontal view of the pelvis as well as frontal and cross-table lateral views of the right hip are obtained. Evaluation is limited by technique and body habitus. No acute fracture, subluxation, or dislocation. Joint spaces are well preserved. Sacroiliac joints are normal. IMPRESSION: 1. Unremarkable pelvis and right hip. Electronically Signed   By: Sharlet Salina M.D.   On: 12/16/2020 20:27     Assessment and Plan:   Preop cardiovascular evaluation -She has no ischemic symptoms, but her activity level is very low. -Because she can take care of herself, walk indoors, and do light work around the house, her DASI score is 7.2 which gives her a functional capacity in METS of 3.63. -Her RCRI score is 0 giving her a 0.4% perioperative risk of major cardiac events. -  She is having no ischemic symptoms, no heart failure symptoms. -We will attempt to get the echocardiogram prior to surgery, but this should not delay surgery.  2.  Syncope: - Her symptoms started at the end of the meal that was much larger than usual for her, especially considering her history of gastric bypass - She was also nauseated and thought she was going to vomit - She was not lightheaded sitting on the couch, but became lightheaded after she started ambulating - This is most likely vasovagal syncope -MD advise on wearing a monitor after discharge   Risk Assessment/Risk Scores:      For questions or updates, please contact [facility] Please consult [email or web address] for contact info under    Signed, Theodore Demark, PA-C  12/17/2020 8:23 AM As above, patient seen and examined.  Briefly she is a 65 year old female with past medical history of diabetes mellitus, hypertension, hyperlipidemia, HIV, previous gastric bypass surgery, morbid obesity for evaluation of syncope and preoperative evaluation prior to repair of femur fracture.  Patient has limited mobility but denies dyspnea on exertion, orthopnea, PND, pedal edema, exertional chest pain or history of syncope.  Yesterday after eating dinner she developed nausea.  She sat down on the couch and her nausea worsened.  She stood and took 2 steps to go to the bathroom and then had frank syncope.  She was unconscious for 30 seconds and apparently received 3-4 chest compressions.  After waking there was no other symptoms.  She denies preceding chest pain, dyspnea or palpitations.  There was no incontinence or seizure activity.  She has been admitted and found to have a femur fracture and cardiology asked to evaluate.  Electrocardiogram shows normal sinus rhythm with no ST changes.  1 syncope-etiology appears to be secondary to vagal episode (occurred in  the setting of nausea) as well as orthostasis.  Electrocardiogram is normal.  We will arrange echocardiogram to assess LV function.  If normal will not pursue further evaluation unless she has recurrent episodes in the future.  I do not think a monitor would be revealing.  I discussed that she should not drive for 6 months following a syncopal episode.  2 preoperative evaluation prior to femur repair-patient denies chest pain or dyspnea though her mobility is limited.  Her electrocardiogram is normal.  If her echocardiogram shows normal LV function she may proceed without further evaluation.  3 hypertension-blood pressure medicines are on hold.  We will follow in-house and adjust  regimen as indicated.  Would discontinue HCTZ long-term if possible given possible orthostasis as cause of syncope.  4 hyperlipidemia-continue statin.  5 femur fracture-Per orthopedics.  Olga Millers, MD

## 2020-12-18 ENCOUNTER — Inpatient Hospital Stay (HOSPITAL_COMMUNITY): Payer: Medicare HMO

## 2020-12-18 ENCOUNTER — Encounter (HOSPITAL_COMMUNITY)
Admission: EM | Disposition: A | Discharge status: Home Health | Payer: Self-pay | Source: Home / Self Care | Attending: Internal Medicine

## 2020-12-18 ENCOUNTER — Inpatient Hospital Stay (HOSPITAL_COMMUNITY): Payer: Medicare HMO | Admitting: Anesthesiology

## 2020-12-18 DIAGNOSIS — R55 Syncope and collapse: Secondary | ICD-10-CM | POA: Diagnosis not present

## 2020-12-18 HISTORY — PX: ORIF FEMUR FRACTURE: SHX2119

## 2020-12-18 HISTORY — PX: ORIF PERIPROSTHETIC FRACTURE: SHX5034

## 2020-12-18 LAB — GLUCOSE, CAPILLARY
Glucose-Capillary: 109 mg/dL — ABNORMAL HIGH (ref 70–99)
Glucose-Capillary: 142 mg/dL — ABNORMAL HIGH (ref 70–99)
Glucose-Capillary: 141 mg/dL — ABNORMAL HIGH (ref 70–99)
Glucose-Capillary: 130 mg/dL — ABNORMAL HIGH (ref 70–99)
Glucose-Capillary: 141 mg/dL — ABNORMAL HIGH (ref 70–99)

## 2020-12-18 LAB — SURGICAL PCR SCREEN
MRSA, PCR: NEGATIVE
Staphylococcus aureus: NEGATIVE

## 2020-12-18 SURGERY — OPEN REDUCTION INTERNAL FIXATION (ORIF) DISTAL FEMUR FRACTURE
Anesthesia: General | Laterality: Right

## 2020-12-18 MED ORDER — ONDANSETRON HCL 4 MG/2ML IJ SOLN
INTRAMUSCULAR | Status: AC
  Filled 2020-12-18: qty 2

## 2020-12-18 MED ORDER — GLYCOPYRROLATE PF 0.2 MG/ML IJ SOSY
PREFILLED_SYRINGE | INTRAMUSCULAR | Status: DC | PRN
  Administered 2020-12-18: .2 mg via INTRAVENOUS

## 2020-12-18 MED ORDER — DEXAMETHASONE SODIUM PHOSPHATE 10 MG/ML IJ SOLN
INTRAMUSCULAR | Status: AC
  Filled 2020-12-18: qty 1

## 2020-12-18 MED ORDER — ENOXAPARIN SODIUM 60 MG/0.6ML IJ SOSY
60.0000 mg | PREFILLED_SYRINGE | INTRAMUSCULAR | Status: DC
  Administered 2020-12-19: 10:00:00 60 mg via SUBCUTANEOUS
  Filled 2020-12-18: qty 0.6

## 2020-12-18 MED ORDER — SUGAMMADEX SODIUM 200 MG/2ML IV SOLN
INTRAVENOUS | Status: DC | PRN
  Administered 2020-12-18: 200 mg via INTRAVENOUS

## 2020-12-18 MED ORDER — ACETAMINOPHEN 10 MG/ML IV SOLN
INTRAVENOUS | Status: DC | PRN
  Administered 2020-12-18: 1000 mg via INTRAVENOUS

## 2020-12-18 MED ORDER — DEXAMETHASONE SODIUM PHOSPHATE 10 MG/ML IJ SOLN
INTRAMUSCULAR | Status: DC | PRN
  Administered 2020-12-18: 10 mg via INTRAVENOUS

## 2020-12-18 MED ORDER — 0.9 % SODIUM CHLORIDE (POUR BTL) OPTIME
TOPICAL | Status: DC | PRN
  Administered 2020-12-18: 1000 mL

## 2020-12-18 MED ORDER — PROPOFOL 10 MG/ML IV BOLUS
INTRAVENOUS | Status: AC
  Filled 2020-12-18: qty 20

## 2020-12-18 MED ORDER — GLYCOPYRROLATE PF 0.2 MG/ML IJ SOSY
PREFILLED_SYRINGE | INTRAMUSCULAR | Status: AC
  Filled 2020-12-18: qty 1

## 2020-12-18 MED ORDER — VANCOMYCIN HCL 1000 MG IV SOLR
INTRAVENOUS | Status: DC | PRN
  Administered 2020-12-18: 1000 mg via TOPICAL

## 2020-12-18 MED ORDER — CEFAZOLIN SODIUM-DEXTROSE 2-4 GM/100ML-% IV SOLN
2.0000 g | Freq: Three times a day (TID) | INTRAVENOUS | Status: AC
  Administered 2020-12-18 (×2): 2 g via INTRAVENOUS
  Filled 2020-12-18 (×2): qty 100

## 2020-12-18 MED ORDER — ENSURE ENLIVE PO LIQD
237.0000 mL | Freq: Two times a day (BID) | ORAL | Status: DC
Start: 2020-12-18 — End: 2020-12-21
  Administered 2020-12-20 – 2020-12-21 (×2): 237 mL via ORAL

## 2020-12-18 MED ORDER — PHENYLEPHRINE HCL (PRESSORS) 10 MG/ML IV SOLN
INTRAVENOUS | Status: DC | PRN
  Administered 2020-12-18 (×2): 80 ug via INTRAVENOUS

## 2020-12-18 MED ORDER — PHENYLEPHRINE 40 MCG/ML (10ML) SYRINGE FOR IV PUSH (FOR BLOOD PRESSURE SUPPORT)
PREFILLED_SYRINGE | INTRAVENOUS | Status: AC
  Filled 2020-12-18: qty 10

## 2020-12-18 MED ORDER — FENTANYL CITRATE (PF) 250 MCG/5ML IJ SOLN
INTRAMUSCULAR | Status: DC | PRN
  Administered 2020-12-18: 100 ug via INTRAVENOUS
  Administered 2020-12-18: 50 ug via INTRAVENOUS

## 2020-12-18 MED ORDER — MIDAZOLAM HCL 2 MG/2ML IJ SOLN
INTRAMUSCULAR | Status: AC
  Filled 2020-12-18: qty 2

## 2020-12-18 MED ORDER — FENTANYL CITRATE (PF) 250 MCG/5ML IJ SOLN
INTRAMUSCULAR | Status: AC
  Filled 2020-12-18: qty 5

## 2020-12-18 MED ORDER — ONDANSETRON HCL 4 MG/2ML IJ SOLN
INTRAMUSCULAR | Status: DC | PRN
  Administered 2020-12-18: 4 mg via INTRAVENOUS

## 2020-12-18 MED ORDER — ALBUMIN HUMAN 5 % IV SOLN: INTRAVENOUS | Status: DC | PRN

## 2020-12-18 MED ORDER — LACTATED RINGERS IV SOLN: INTRAVENOUS | Status: DC | PRN

## 2020-12-18 MED ORDER — HYDROMORPHONE HCL 1 MG/ML IJ SOLN
INTRAMUSCULAR | Status: AC
  Administered 2020-12-18: 0.5 mg via INTRAVENOUS
  Filled 2020-12-18: qty 1

## 2020-12-18 MED ORDER — PROPOFOL 10 MG/ML IV BOLUS
INTRAVENOUS | Status: DC | PRN
  Administered 2020-12-18: 130 mg via INTRAVENOUS

## 2020-12-18 MED ORDER — PHENYLEPHRINE HCL-NACL 20-0.9 MG/250ML-% IV SOLN
INTRAVENOUS | Status: DC | PRN
  Administered 2020-12-18: 50 ug/min via INTRAVENOUS

## 2020-12-18 MED ORDER — VANCOMYCIN HCL 1000 MG IV SOLR
INTRAVENOUS | Status: AC
  Filled 2020-12-18: qty 20

## 2020-12-18 MED ORDER — LIDOCAINE 2% (20 MG/ML) 5 ML SYRINGE
INTRAMUSCULAR | Status: AC
  Filled 2020-12-18: qty 5

## 2020-12-18 MED ORDER — HYDROMORPHONE HCL 1 MG/ML IJ SOLN
0.2500 mg | INTRAMUSCULAR | Status: DC | PRN
Start: 2020-12-18 — End: 2020-12-18
  Administered 2020-12-18: 0.5 mg via INTRAVENOUS

## 2020-12-18 MED ORDER — MIDAZOLAM HCL 2 MG/2ML IJ SOLN
INTRAMUSCULAR | Status: DC | PRN
  Administered 2020-12-18: 2 mg via INTRAVENOUS

## 2020-12-18 MED ORDER — ADULT MULTIVITAMIN W/MINERALS CH
1.0000 | ORAL_TABLET | Freq: Every day | ORAL | Status: DC
  Administered 2020-12-18 – 2020-12-21 (×4): 1 via ORAL
  Filled 2020-12-18 (×4): qty 1

## 2020-12-18 MED ORDER — LIDOCAINE 2% (20 MG/ML) 5 ML SYRINGE
INTRAMUSCULAR | Status: DC | PRN
  Administered 2020-12-18: 100 mg via INTRAVENOUS

## 2020-12-18 MED ORDER — ROCURONIUM BROMIDE 10 MG/ML (PF) SYRINGE
PREFILLED_SYRINGE | INTRAVENOUS | Status: AC
  Filled 2020-12-18: qty 10

## 2020-12-18 MED ORDER — ROCURONIUM BROMIDE 10 MG/ML (PF) SYRINGE
PREFILLED_SYRINGE | INTRAVENOUS | Status: DC | PRN
  Administered 2020-12-18: 100 mg via INTRAVENOUS

## 2020-12-18 SURGICAL SUPPLY — 66 items
ALCOHOL 70% 16 OZ (MISCELLANEOUS) ×2 IMPLANT
BIT DRILL 4.3 (BIT) ×2
BIT DRILL 4.3X300MM (BIT) ×1 IMPLANT
BIT DRILL LONG 3.3 (BIT) ×4 IMPLANT
BIT DRILL QC 3.3X195 (BIT) ×2 IMPLANT
BNDG ELASTIC 4X5.8 VLCR STR LF (GAUZE/BANDAGES/DRESSINGS) ×2 IMPLANT
BNDG ELASTIC 6X10 VLCR STRL LF (GAUZE/BANDAGES/DRESSINGS) ×2 IMPLANT
CANISTER SUCT 3000ML PPV (MISCELLANEOUS) ×2 IMPLANT
CAP LOCK NCB (Cap) ×16 IMPLANT
CHLORAPREP W/TINT 26 (MISCELLANEOUS) ×6 IMPLANT
COVER SURGICAL LIGHT HANDLE (MISCELLANEOUS) ×2 IMPLANT
DRAPE C-ARM 42X72 X-RAY (DRAPES) ×2 IMPLANT
DRAPE C-ARMOR (DRAPES) ×2 IMPLANT
DRAPE INCISE IOBAN 66X45 STRL (DRAPES) ×2 IMPLANT
DRAPE ORTHO SPLIT 87X125 STRL (DRAPES) ×4 IMPLANT
DRAPE U-SHAPE 47X51 STRL (DRAPES) ×2 IMPLANT
DRSG ADAPTIC 3X8 NADH LF (GAUZE/BANDAGES/DRESSINGS) ×2 IMPLANT
DRSG TEGADERM 4X4.75 (GAUZE/BANDAGES/DRESSINGS) ×4 IMPLANT
ELECT REM PT RETURN 9FT ADLT (ELECTROSURGICAL) ×2
ELECTRODE REM PT RTRN 9FT ADLT (ELECTROSURGICAL) ×1 IMPLANT
GAUZE SPONGE 4X4 12PLY STRL (GAUZE/BANDAGES/DRESSINGS) ×2 IMPLANT
GLOVE SRG 8 PF TXTR STRL LF DI (GLOVE) ×1 IMPLANT
GLOVE SURG POLYISO LF SZ8 (GLOVE) ×6 IMPLANT
GLOVE SURG UNDER POLY LF SZ8 (GLOVE) ×1
GOWN STRL REUS W/ TWL LRG LVL3 (GOWN DISPOSABLE) ×1 IMPLANT
GOWN STRL REUS W/ TWL XL LVL3 (GOWN DISPOSABLE) ×2 IMPLANT
GOWN STRL REUS W/TWL LRG LVL3 (GOWN DISPOSABLE) ×1
GOWN STRL REUS W/TWL XL LVL3 (GOWN DISPOSABLE) ×2
K-WIRE 2.0 (WIRE) ×3
K-WIRE FXSTD 280X2XNS SS (WIRE) ×3
KIT BASIN OR (CUSTOM PROCEDURE TRAY) ×2 IMPLANT
KIT TURNOVER KIT B (KITS) ×2 IMPLANT
KWIRE FXSTD 280X2XNS SS (WIRE) ×3 IMPLANT
MAT PREVALON FULL STRYKER (MISCELLANEOUS) ×2 IMPLANT
NS IRRIG 1000ML POUR BTL (IV SOLUTION) ×2 IMPLANT
PACK TOTAL JOINT (CUSTOM PROCEDURE TRAY) ×2 IMPLANT
PAD ARMBOARD 7.5X6 YLW CONV (MISCELLANEOUS) ×4 IMPLANT
PAD CAST 4YDX4 CTTN HI CHSV (CAST SUPPLIES) ×1 IMPLANT
PADDING CAST COTTON 4X4 STRL (CAST SUPPLIES) ×1
PADDING CAST COTTON 6X4 STRL (CAST SUPPLIES) ×2 IMPLANT
PLATE FEM DIST NCB PP 278MM (Plate) ×2 IMPLANT
SCREW 5.0 60MM (Screw) ×2 IMPLANT
SCREW 5.0 80MM (Screw) ×4 IMPLANT
SCREW CORT NCB SELFTAP 5.0X50 (Screw) ×2 IMPLANT
SCREW CORTICAL NCB 5.0X90MM (Screw) ×2 IMPLANT
SCREW NCB 3.5X75X5X6.2XST (Screw) ×1 IMPLANT
SCREW NCB 4.0MX34M (Screw) ×2 IMPLANT
SCREW NCB 5.0X34MM (Screw) ×2 IMPLANT
SCREW NCB 5.0X36MM (Screw) ×2 IMPLANT
SCREW NCB 5.0X38 (Screw) ×4 IMPLANT
SCREW NCB 5.0X75MM (Screw) ×1 IMPLANT
SCREW NCB 5.0X85MM (Screw) ×4 IMPLANT
SPONGE T-LAP 18X18 ~~LOC~~+RFID (SPONGE) ×4 IMPLANT
STOCKINETTE IMPERVIOUS LG (DRAPES) ×2 IMPLANT
SUT ETHILON 3 0 PS 1 (SUTURE) ×8 IMPLANT
SUT MNCRL AB 4-0 PS2 18 (SUTURE) IMPLANT
SUT MON AB 2-0 CT1 27 (SUTURE) IMPLANT
SUT VIC AB 0 CT1 27 (SUTURE) ×2
SUT VIC AB 0 CT1 27XBRD ANBCTR (SUTURE) ×2 IMPLANT
SUT VIC AB 1 CT1 27 (SUTURE) ×1
SUT VIC AB 1 CT1 27XBRD ANBCTR (SUTURE) ×1 IMPLANT
SUT VIC AB 2-0 CT1 27 (SUTURE) ×2
SUT VIC AB 2-0 CT1 TAPERPNT 27 (SUTURE) ×2 IMPLANT
TOWEL GREEN STERILE (TOWEL DISPOSABLE) ×2 IMPLANT
TOWEL GREEN STERILE FF (TOWEL DISPOSABLE) ×2 IMPLANT
WATER STERILE IRR 1000ML POUR (IV SOLUTION) ×2 IMPLANT

## 2020-12-18 NOTE — Anesthesia Postprocedure Evaluation (Signed)
Anesthesia Post Note  Patient: Yakira A Tolley  Procedure(s) Performed: OPEN REDUCTION INTERNAL FIXATION (ORIF) DISTAL FEMUR FRACTURE (Right)     Patient location during evaluation: PACU Anesthesia Type: General Level of consciousness: awake and alert Pain management: pain level controlled Vital Signs Assessment: post-procedure vital signs reviewed and stable Respiratory status: spontaneous breathing, nonlabored ventilation and respiratory function stable Cardiovascular status: blood pressure returned to baseline and stable Postop Assessment: no apparent nausea or vomiting Anesthetic complications: no   No notable events documented.  Last Vitals:  Vitals:   12/18/20 1154 12/18/20 1210  BP: 131/66 (!) 124/49  Pulse: 72 64  Resp: 16 14  Temp:  36.6 C  SpO2: 100% 100%    Last Pain:  Vitals:   12/18/20 1210  TempSrc:   PainSc: 4         RLE Motor Response: Purposeful movement (12/18/20 1210) RLE Sensation: Full sensation (12/18/20 1210)      Eriel Doyon,W. EDMOND

## 2020-12-18 NOTE — Consult Note (Signed)
ORTHOPAEDIC CONSULTATION  REQUESTING PHYSICIAN: Darlin Drop, DO  Chief Complaint: Right knee injury  HPI: Helen James is a 65 y.o. female with history of a right total knee replacement done in 2009 in [place] who had a fall at home yesterday and was seen in the emergency department was found to have a periprosthetic distal femur fracture.  She also has a history of HIV with a negative viral load and is followed by infectious disease.  She denies pain in other joints or extremities she denies distal numbness and tingling.  Past Medical History:  Diagnosis Date   DM2 (diabetes mellitus, type 2) (HCC)    HIV disease (HCC) 12/03/2015   HTN (hypertension)    Mixed hyperlipidemia    Morbid obesity with BMI of 40.0-44.9, adult [facility])    Past Surgical History:  Procedure Laterality Date   GASTRIC BYPASS OPEN  2006   HAMMER TOE SURGERY     TOTAL KNEE ARTHROPLASTY Right 2009   Social History   Socioeconomic History   Marital status: Single    Spouse name: Not on file   Number of children: Not on file   Years of education: Not on file   Highest education level: Not on file  Occupational History   Not on file  Tobacco Use   Smoking status: Never   Smokeless tobacco: Never  Substance and Sexual Activity   Alcohol use: No   Drug use: No   Sexual activity: Not Currently  Other Topics Concern   Not on file  Social History Narrative   Not on file   Social Determinants of Health   Financial Resource Strain: Not on file  Food Insecurity: Not on file  Transportation Needs: Not on file  Physical Activity: Not on file  Stress: Not on file  Social Connections: Not on file   Family History  Problem Relation Age of Onset   CAD Mother    Diabetes Other    Allergies  Allergen Reactions   Ramipril Swelling    Facial swelling   Aspirin Tinitus   Feldene [Piroxicam] Other (See Comments)    States causes eyelids to "jump"   Latex Itching   Nsaids Other (See Comments)     Ears ringing   Percocet [Oxycodone-Acetaminophen] Other (See Comments)    Hallucinations per patient   Tylenol [Acetaminophen] Itching     Positive ROS: All other systems have been reviewed and were otherwise negative with the exception of those mentioned in the HPI and as above.  Physical Exam: General: Alert, no acute distress Cardiovascular: No pedal edema Respiratory: No cyanosis, no use of accessory musculature Skin: No lesions in the area of chief complaint Neurologic: Sensation intact distally Psychiatric: Patient is competent for consent with normal mood and affect  MUSCULOSKELETAL:  LLE No traumatic wounds, ecchymosis, or rash  Nontender  No knee or ankle effusion  Knee stable to varus/ valgus and anterior/posterior stress  Sens DPN, SPN, TN intact  Motor EHL, ext, flex, evers 5/5  DP 2+, No significant edema RLE well-healed anterior knee incision  no traumatic wounds, ecchymosis, or rash  Tender about the distal femur  Knee range of motion deferred given known fracture  No ankle effusion  Sens DPN, SPN, TN intact  Motor EHL, ext, flex, evers 5/5  DP 2+, No significant edema    IMAGING: X-rays and CT scan of the right knee demonstrate comminuted distal femur fracture.  Distal femoral component appears to still be intact to distal  femoral bone  Assessment: Principal Problem:   Syncope Active Problems:   HIV disease (HCC)   H/O gastric bypass   HTN (hypertension)   HSV infection   Anemia   Syncope, vasovagal   Femoral distal fracture (HCC)  Right periprosthetic distal femur fracture  Plan: Given the patient's fracture pattern this is amenable to open reduction internal fixation.  Given her morbid obesity and large leg size she is at increased risk for infection, wound healing problems, malunion.   The risks benefits and alternatives were discussed with the patient including but not limited to the risks of nonoperative treatment, versus surgical  intervention including infection, bleeding, nerve injury, malunion, nonunion, the need for revision surgery, hardware prominence, hardware failure, the need for hardware removal, blood clots, cardiopulmonary complications, morbidity, mortality, among others, and they were willing to proceed.  Consent was signed by myself and the patient.  Right was marked.     Joen Laura, MD Cell [phone]

## 2020-12-18 NOTE — Progress Notes (Signed)
Report given to pre-op nurse over phone. Pre-op RN stated patient would be picked up for procedure at 0630. Hibiclens and CHG bath completed. Pt unable to take ordered tylenol due to allergy. Pt unable to sign consent due to multiple questions for the surgeon by both patient and family members. Pt has been NPO since MN. G2/ensure was unavailable to be administered overnight.

## 2020-12-18 NOTE — Progress Notes (Addendum)
Initial Nutrition Assessment  DOCUMENTATION CODES:   Morbid obesity  INTERVENTION:   Ensure Enlive po BID, each supplement provides 350 kcal and 20 grams of protein  MVI with minerals daily  NUTRITION DIAGNOSIS:   Increased nutrient needs related to hip fracture as evidenced by estimated needs.  GOAL:   Patient will meet greater than or equal to 90% of their needs  MONITOR:   PO intake, Supplement acceptance  REASON FOR ASSESSMENT:   Consult Hip fracture protocol  ASSESSMENT:   65 yo female admitted with R distal femur fracture s/p syncopal episode and fall at home. PMH includes gastric bypass 2006, HIV, DM-2, HTN, HLD.  Patient just back from surgery to repair broken hip. Unable to speak with her of complete nutrition focused physical exam at this time.  Diet has been advanced to regular.  Meal intakes not documented.  Labs reviewed.  CBG: 142  Medications reviewed and include Novolog.  Weight history reviewed. No significant weight changes noted.   Diet Order:   Diet Order             Diet regular Room service appropriate? Yes; Fluid consistency: Thin  Diet effective now                   EDUCATION NEEDS:   Not appropriate for education at this time  Skin:  Skin Assessment: Reviewed RN Assessment  Last BM:  11/24  Height:   Ht Readings from Last 1 Encounters:  12/16/20 5\' 3"  (1.6 m)    Weight:   Wt Readings from Last 1 Encounters:  12/16/20 110.7 kg    BMI:  Body mass index is 43.22 kg/m.  Estimated Nutritional Needs:   Kcal:  1800-2000  Protein:  100-120 gm  Fluid:  >/= 2 L    12/18/20, RD, LDN, CNSC Please refer to Amion for contact information.

## 2020-12-18 NOTE — Transfer of Care (Signed)
Immediate Anesthesia Transfer of Care Note  Patient: Helen James  Procedure(s) Performed: OPEN REDUCTION INTERNAL FIXATION (ORIF) DISTAL FEMUR FRACTURE (Right)  Patient Location: PACU  Anesthesia Type:General  Level of Consciousness: oriented, drowsy, patient cooperative and responds to stimulation  Airway & Oxygen Therapy: Patient Spontanous Breathing and Patient connected to nasal cannula oxygen  Post-op Assessment: Report given to RN, Post -op Vital signs reviewed and stable and Patient moving all extremities X 4  Post vital signs: Reviewed and stable  Last Vitals:  Vitals Value Taken Time  BP 130/77 12/18/20 1110  Temp    Pulse 69 12/18/20 1112  Resp 14 12/18/20 1112  SpO2 98 % 12/18/20 1112  Vitals shown include unvalidated device data.  Last Pain:  Vitals:   12/18/20 0336  TempSrc: Oral  PainSc:          Complications: No notable events documented.

## 2020-12-18 NOTE — Anesthesia Procedure Notes (Signed)
Procedure Name: Intubation Date/Time: 12/18/2020 7:53 AM Performed by: Claris Che, CRNA Pre-anesthesia Checklist: Patient identified, Emergency Drugs available, Suction available and Patient being monitored Patient Re-evaluated:Patient Re-evaluated prior to induction Oxygen Delivery Method: Circle system utilized Preoxygenation: Pre-oxygenation with 100% oxygen Induction Type: IV induction and Cricoid Pressure applied Ventilation: Mask ventilation without difficulty Laryngoscope Size: Mac and 4 Grade View: Grade II Tube type: Oral Tube size: 8.0 mm Number of attempts: 1 Airway Equipment and Method: Stylet Placement Confirmation: positive ETCO2, breath sounds checked- equal and bilateral and ETT inserted through vocal cords under direct vision Secured at: 23 cm Tube secured with: Tape Dental Injury: Teeth and Oropharynx as per pre-operative assessment

## 2020-12-18 NOTE — Progress Notes (Signed)
PROGRESS NOTE    Ariea A Pasquariello  ZOX:[id number] DOB: [birth year] DOA: 12/16/2020 PCP: System, Provider Not In    Chief Complaint  Patient presents with   Leg Pain   Loss of Consciousness    Brief Narrative:  Kary Sugrue is a 65 yo w/PMHx of morbid obesity, HIV, DM, HTN who presents w/distal femur fracture in the setting of vasovagal syncope.  POD #0 post right distal femur periprosthetic fracture repair by orthopedic surgery Dr. Blanchie Dessert.  Subjective: 12/18/2020: Patient was seen and examined at her bedside.  Her daughter was present in the room.  She had no pain at the time of this visit.  She states prior to presentation, she felt nauseous after eating her Thanksgiving meal and got up very quickly so she could make it to the bathroom to vomit, felt dizzy then fell.  Per her daughter at bedside she lost consciousness for about 2 minutes.  No tongue biting, no loss of bowel or urine.  Assessment & Plan:   Principal Problem:   Syncope Active Problems:   HIV disease (HCC)   H/O gastric bypass   HTN (hypertension)   HSV infection   Anemia   Syncope, vasovagal   Femoral distal fracture (HCC)  Distal femoral fracture, s/p repair on 12/18/2020 by orthopedic surgery Dr. Blanchie Dessert Comminuted displaced periprosthetic distal right femoral fracture. Appreciate orthopedic surgery's assistance Continue pain control - DVT ppx: lovenox 40 q12h given obesity  Vasovagal Syncope  Occurred in the setting of nausea - Cardiology evaluated: TTE WNL, no further workup IV antiemetics as needed Orthostatic vital signs when able to stand. - unlikely to be PE, if HR or O2 status changes, can consider eval  Elevated D-dimer in the setting of comminuted displaced periprosthetic distal right femoral fracture  D-dimer greater than 10 Closely monitor for any acute changes.  HTN (hypertension)  Home regimen: losartan, HCTZ BP elevated, possibly contributed by pain. Closely monitor vital  signs   HSV infection -stable continue home medication   HIV disease (HCC)  Resume home regimen.   Anemia -chronic, closely monitor.   DVT prophylaxis: lovenox Code Status: full  Disposition:   Status is: Inpatient  Remains inpatient appropriate because: she needs surgery   Consultants:  Orthopedics  Procedures: n/a  Antimicrobials: n/a    Objective: Vitals:   12/18/20 1154 12/18/20 1210 12/18/20 1230 12/18/20 1400  BP: 131/66 (!) 124/49 133/69 (!) 148/61  Pulse: 72 64 68 69  Resp: Temp:  97.9 F (36.6 C) (!) 97.5 F (36.4 C)   TempSrc:   Oral   SpO2: 100% 100% 99% 97%  Weight:      Height:        Intake/Output Summary (Last 24 hours) at 12/18/2020 1609 Last data filed at 12/18/2020 1111 Gross per 24 hour  Intake 3198.54 ml  Output 1550 ml  Net 1648.54 ml   Filed Weights   12/16/20 1820  Weight: 110.7 kg    Examination:  General exam: Well-developed well-nourished in no acute distress.  She is alert and oriented x3.   Respiratory system: Clear to auscultation with no wheezes or rales.   Cardiovascular system: Regular rate and rhythm no rubs or gallops.   Gastrointestinal system: Soft nontender normal bowel sounds present.   Central nervous system: Alert and oriented.   Extremities: Right knee in surgical dressing. Skin: No rashes or ulcerative lesions noted. Neuro: Able to wiggle her toes. Psychiatry: Mood is appropriate for condition and setting.  Data Reviewed: I have personally reviewed following labs and imaging studies  CBC: Recent Labs  Lab 12/16/20 2143 12/17/20 1439  WBC 5.6 6.0  NEUTROABS 3.1  --   HGB 10.8* 9.9*  HCT 33.0* 29.4*  MCV 100.6* 99.7  PLT 191 193    Basic Metabolic Panel: Recent Labs  Lab 12/16/20 2143 12/16/20 2330 12/17/20 0500  NA 140  --  139  K 3.9  --  4.1  CL 115*  --  113*  CO2 18*  --  18*  GLUCOSE 157*  --  123*  BUN 20  --  19  CREATININE 0.97  --  0.84  CALCIUM 8.4*  --  8.4*   MG  --  2.1  --   PHOS  --  3.0  --     GFR: Estimated Creatinine Clearance: 78.7 mL/min (by C-G formula based on SCr of 0.84 mg/dL).  Liver Function Tests: Recent Labs  Lab 12/16/20 2143  AST 26  ALT 19  ALKPHOS 162*  BILITOT 0.2*  PROT 6.2*  ALBUMIN 3.1*    CBG: Recent Labs  Lab 12/17/20 1710 12/17/20 2038 12/17/20 2341 12/18/20 0347 12/18/20 1229  GLUCAP 136* 122* 91 109* 142*     Recent Results (from the past 240 hour(s))  Resp Panel by RT-PCR (Flu A&B, Covid) Nasopharyngeal Swab     Status: None   Collection Time: 12/16/20  8:43 PM   Specimen: Nasopharyngeal Swab; Nasopharyngeal(NP) swabs in vial transport medium  Result Value Ref Range Status   SARS Coronavirus 2 by RT PCR NEGATIVE NEGATIVE Final    Comment: (NOTE) SARS-CoV-2 target nucleic acids are NOT DETECTED.  The SARS-CoV-2 RNA is generally detectable in upper respiratory specimens during the acute phase of infection. The lowest concentration of SARS-CoV-2 viral copies this assay can detect is 138 copies/mL. A negative result does not preclude SARS-Cov-2 infection and should not be used as the sole basis for treatment or other patient management decisions. A negative result may occur with  improper specimen collection/handling, submission of specimen other than nasopharyngeal swab, presence of viral mutation(s) within the areas targeted by this assay, and inadequate number of viral copies(<138 copies/mL). A negative result must be combined with clinical observations, patient history, and epidemiological information. The expected result is Negative.  Fact Sheet for Patients:  BloggerCourse.com  Fact Sheet for Healthcare Providers:  SeriousBroker.it  This test is no t yet approved or cleared by the [place] FDA and  has been authorized for detection and/or diagnosis of SARS-CoV-2 by FDA under an Emergency Use Authorization (EUA). This EUA  will remain  in effect (meaning this test can be used) for the duration of the COVID-19 declaration under Section 564(b)(1) of the Act, 21 U.S.C.section 360bbb-3(b)(1), unless the authorization is terminated  or revoked sooner.       Influenza A by PCR NEGATIVE NEGATIVE Final   Influenza B by PCR NEGATIVE NEGATIVE Final    Comment: (NOTE) The Xpert Xpress SARS-CoV-2/FLU/RSV plus assay is intended as an aid in the diagnosis of influenza from Nasopharyngeal swab specimens and should not be used as a sole basis for treatment. Nasal washings and aspirates are unacceptable for Xpert Xpress SARS-CoV-2/FLU/RSV testing.  Fact Sheet for Patients: BloggerCourse.com  Fact Sheet for Healthcare Providers: SeriousBroker.it  This test is not yet approved or cleared by the [place] FDA and has been authorized for detection and/or diagnosis of SARS-CoV-2 by FDA under an Emergency Use Authorization (EUA). This EUA will remain in  effect (meaning this test can be used) for the duration of the COVID-19 declaration under Section 564(b)(1) of the Act, 21 U.S.C. section 360bbb-3(b)(1), unless the authorization is terminated or revoked.  Performed at [facility], [address] [place], [place] [address]   Surgical pcr screen     Status: None   Collection Time: 12/18/20  3:40 AM   Specimen: Nasal Mucosa; Nasal Swab  Result Value Ref Range Status   MRSA, PCR NEGATIVE NEGATIVE Final   Staphylococcus aureus NEGATIVE NEGATIVE Final    Comment: (NOTE) The Xpert SA Assay (FDA approved for NASAL specimens in patients 92 years of age and older), is one component of a comprehensive surveillance program. It is not intended to diagnose infection nor to guide or monitor treatment. Performed at [facility], [address], [place], [place] [address]          Radiology Studies: DG Chest 1 View  Result Date:  12/16/2020 CLINICAL DATA:  Fall Rt leg pain no chest complaints, best obtainable images due to patient size and uncontrolled pain EXAM: CHEST  1 VIEW COMPARISON:  None. FINDINGS: Slightly prominent cardiac silhouette. Otherwise the heart and mediastinal contours are within normal limits. Aortic calcification No focal consolidation. No pulmonary edema. No pleural effusion. No pneumothorax. No acute osseous abnormality. Severe degenerative changes of the right shoulder from IMPRESSION: No active disease. Electronically Signed   By: Tish Frederickson M.D.   On: 12/16/2020 20:37   CT KNEE RIGHT WO CONTRAST  Result Date: 12/17/2020 CLINICAL DATA:  Femur fracture, preop EXAM: CT OF THE right KNEE WITHOUT CONTRAST TECHNIQUE: Multidetector CT imaging of the right knee was performed according to the standard protocol. Multiplanar CT image reconstructions were also generated. COMPARISON:  Right knee x-ray 12/16/2020 FINDINGS: Bones/Joint/Cartilage Previous total right knee arthroplasty surgical changes. There is an acute comminuted periprosthetic fracture at the distal diaphysis and metaphysis of the femur with moderate displacement of fracture fragments measuring up to 1.6 cm and medial angulation of the distal fragment. No additional fracture identified. Lipohemarthrosis partially visualized. Ligaments Suboptimally assessed by CT. Muscles and Tendons Limited visualization due to metallic artifacts. Soft tissues Unremarkable. IMPRESSION: 1. Acute comminuted periprosthetic displaced fracture of the distal diaphysis and metaphysis of the femur. 2. Lipohemarthrosis. Electronically Signed   By: Jannifer Hick M.D.   On: 12/17/2020 11:38   DG Knee Complete 4 Views Right  Result Date: 12/16/2020 CLINICAL DATA:  Syncope, right leg pain EXAM: RIGHT KNEE - COMPLETE 4+ VIEW COMPARISON:  None. FINDINGS: Frontal, bilateral oblique, and cross-table lateral views of the right knee are obtained. There is a 3 component right  knee arthroplasty identified. A comminuted periprosthetic fracture is seen involving the distal right femoral metadiaphyseal junction, with moderate displacement and ventral angulation at the fracture site. There is diffuse soft tissue swelling. IMPRESSION: 1. Comminuted displaced periprosthetic distal right femoral fracture. Electronically Signed   By: Sharlet Salina M.D.   On: 12/16/2020 20:28   DG C-Arm 1-60 Min-No Report  Result Date: 12/18/2020 Fluoroscopy was utilized by the requesting physician.  No radiographic interpretation.   ECHOCARDIOGRAM COMPLETE  Result Date: 12/17/2020    ECHOCARDIOGRAM REPORT   Patient Name:   Milana Huntsman Date of Exam: 12/17/2020 Medical Rec #:  [id number]        Height:       63.0 in Accession #:    [id number]       Weight:       244.0 lb  Date of Birth:  [birth year]       BSA:          2.104 m Patient Age:    27 years         BP:           117/76 mmHg Patient Gender: F                HR:           71 bpm. Exam Location:  Inpatient Procedure: 2D Echo, Cardiac Doppler, Color Doppler and Intracardiac            Opacification Agent Indications:    Syncope R55  History:        Patient has no prior history of Echocardiogram examinations.                 Risk Factors:Hypertension, Diabetes and Dyslipidemia. Preop                 cardiovascular evaluation.  Sonographer:    Leta Jungling RDCS Referring Phys: [id number] ANASTASSIA DOUTOVA IMPRESSIONS  1. Left ventricular ejection fraction, by estimation, is 55 to 60%. The left ventricle has normal function. The left ventricle has no regional wall motion abnormalities. Left ventricular diastolic parameters were normal.  2. Right ventricular systolic function is normal. The right ventricular size is normal. There is normal pulmonary artery systolic pressure.  3. Cannot r/o PFO.  4. The pericardial effusion is posterior to the left ventricle.  5. The mitral valve is normal in structure. No evidence of mitral valve regurgitation. No  evidence of mitral stenosis.  6. The aortic valve is tricuspid. There is mild calcification of the aortic valve. Aortic valve regurgitation is trivial. Aortic valve sclerosis is present, with no evidence of aortic valve stenosis.  7. The inferior vena cava is normal in size with greater than 50% respiratory variability, suggesting right atrial pressure of 3 mmHg. FINDINGS  Left Ventricle: Left ventricular ejection fraction, by estimation, is 55 to 60%. The left ventricle has normal function. The left ventricle has no regional wall motion abnormalities. Definity contrast agent was given IV to delineate the left ventricular  endocardial borders. The left ventricular internal cavity size was normal in size. There is no left ventricular hypertrophy. Left ventricular diastolic parameters were normal. Right Ventricle: The right ventricular size is normal. No increase in right ventricular wall thickness. Right ventricular systolic function is normal. There is normal pulmonary artery systolic pressure. The tricuspid regurgitant velocity is 2.54 m/s, and  with an assumed right atrial pressure of 3 mmHg, the estimated right ventricular systolic pressure is 28.8 mmHg. Left Atrium: Left atrial size was normal in size. Right Atrium: Right atrial size was normal in size. Pericardium: Trivial pericardial effusion is present. The pericardial effusion is posterior to the left ventricle. Mitral Valve: The mitral valve is normal in structure. No evidence of mitral valve regurgitation. No evidence of mitral valve stenosis. Tricuspid Valve: The tricuspid valve is normal in structure. Tricuspid valve regurgitation is mild . No evidence of tricuspid stenosis. Aortic Valve: The aortic valve is tricuspid. There is mild calcification of the aortic valve. Aortic valve regurgitation is trivial. Aortic valve sclerosis is present, with no evidence of aortic valve stenosis. Pulmonic Valve: The pulmonic valve was normal in structure. Pulmonic  valve regurgitation is not visualized. No evidence of pulmonic stenosis. Aorta: The aortic root is normal in size and structure. Venous: The inferior vena cava is normal in size with greater than  50% respiratory variability, suggesting right atrial pressure of 3 mmHg. IAS/Shunts: The interatrial septum was not well visualized.  LEFT VENTRICLE PLAX 2D LVIDd:         4.00 cm     Diastology LVIDs:         2.30 cm     LV e' medial:    7.83 cm/s LV PW:         0.80 cm     LV E/e' medial:  11.8 LV IVS:        0.80 cm     LV e' lateral:   9.36 cm/s LVOT diam:     1.90 cm     LV E/e' lateral: 9.9 LV SV:         77 LV SV Index:   36 LVOT Area:     2.84 cm  LV Volumes (MOD) LV vol d, MOD A2C: 89.5 ml LV vol d, MOD A4C: 94.7 ml LV vol s, MOD A2C: 20.8 ml LV vol s, MOD A4C: 28.4 ml LV SV MOD A2C:     68.7 ml LV SV MOD A4C:     94.7 ml LV SV MOD BP:      68.3 ml LEFT ATRIUM             Index LA diam:        2.60 cm 1.24 cm/m LA Vol (A2C):   35.1 ml 16.68 ml/m LA Vol (A4C):   33.8 ml 16.04 ml/m LA Biplane Vol: 31.1 ml 14.78 ml/m  AORTIC VALVE             PULMONIC VALVE LVOT Vmax:   130.00 cm/s PR End Diast Vel: 4.58 msec LVOT Vmean:  90.800 cm/s LVOT VTI:    0.270 m  AORTA Ao Root diam: 2.70 cm Ao Asc diam:  3.00 cm MITRAL VALVE               TRICUSPID VALVE MV Area (PHT): 3.99 cm    TR Peak grad:   25.8 mmHg MV Decel Time: 190 msec    TR Vmax:        254.00 cm/s MV E velocity: 92.40 cm/s MV A velocity: 86.90 cm/s  SHUNTS MV E/A ratio:  1.06        Systemic VTI:  0.27 m                            Systemic Diam: 1.90 cm Charlton Haws MD Electronically signed by Charlton Haws MD Signature Date/Time: 12/17/2020/10:08:46 AM    Final    DG Hip Unilat W or Wo Pelvis 2-3 Views Right  Result Date: 12/16/2020 CLINICAL DATA:  Syncope, right hip pain EXAM: DG HIP (WITH OR WITHOUT PELVIS) 2-3V RIGHT COMPARISON:  None. FINDINGS: Frontal view of the pelvis as well as frontal and cross-table lateral views of the right hip are obtained.  Evaluation is limited by technique and body habitus. No acute fracture, subluxation, or dislocation. Joint spaces are well preserved. Sacroiliac joints are normal. IMPRESSION: 1. Unremarkable pelvis and right hip. Electronically Signed   By: Sharlet Salina M.D.   On: 12/16/2020 20:27   DG FEMUR, MIN 2 VIEWS RIGHT  Result Date: 12/18/2020 CLINICAL DATA:  Postop, right femur ORIF. EXAM: RIGHT FEMUR 2 VIEWS COMPARISON:  12/17/2020. FINDINGS: Submitted images show placement of a long lateral fixation plate extending from the proximal femoral shaft to the distal femur, reducing the primary fracture components, with  significantly improved alignment, approximately 5-6 mm of residual medial displacement. The orthopedic hardware is well seated. Knee prosthetic components are normally aligned. IMPRESSION: Successful reduction of the comminuted distal right femur fracture following ORIF. Electronically Signed   By: Amie Portland M.D.   On: 12/18/2020 11:43   DG FEMUR, MIN 2 VIEWS RIGHT  Result Date: 12/18/2020 CLINICAL DATA:  ORIF right femur fracture EXAM: RIGHT FEMUR 2 VIEWS; DG C-ARM 1-60 MIN-NO REPORT COMPARISON:  Right femur radiographs-12/17/2020 FLUOROSCOPY TIME:  3 minutes, 23 seconds (7 mGy) FINDINGS: 7 spot intraoperative fluoroscopic images of the distal aspect of the right femur are provided for review and demonstrate the sequela of sideplate fixation of comminuted distal periprosthetic femur fracture. There is improved alignment of the fracture fragments with persistent mild displacement of the medially located fracture fragment. Stable sequela of previous right total knee replacement, incompletely evaluated. There is expected subcutaneous emphysema about the operative site. No radiopaque foreign body. IMPRESSION: Post sideplate fixation of comminuted distal periprosthetic femur fracture without evidence of complication. Electronically Signed   By: Simonne Come M.D.   On: 12/18/2020 11:06   DG FEMUR,  MIN 2 VIEWS RIGHT  Result Date: 12/17/2020 CLINICAL DATA:  Fall, right leg pain EXAM: RIGHT FEMUR 2 VIEWS COMPARISON:  12/16/2020 FINDINGS: Unchanged, comminuted, angulated and displaced periprosthetic fracture of the distal right femur status post right knee total arthroplasty. The proximal right femur is intact. Examination is generally somewhat limited by large body habitus. Soft tissues are unremarkable. IMPRESSION: Unchanged, comminuted, angulated and displaced periprosthetic fracture of the distal right femur status post right knee total arthroplasty. The proximal right femur is intact. Electronically Signed   By: Jearld Lesch M.D.   On: 12/17/2020 11:21   VAS US CAROTID  Result Date: 12/17/2020 Carotid Arterial Duplex Study Patient Name:  Milana Huntsman  Date of Exam:   12/17/2020 Medical Rec #: [id number]         Accession #:    [id number] Date of Birth: 05/19/1955        Patient Gender: F Patient Age:   60 years Exam [facility] Procedure:      VAS US CAROTID Referring Phys: Jonny Ruiz DOUTOVA --------------------------------------------------------------------------------  Indications:       Syncope. Risk Factors:      Hypertension, Diabetes, no history of smoking. Comparison Study:  No previous exams Performing Technologist: Jody Hill RVT, RDMS  Examination Guidelines: A complete evaluation includes B-mode imaging, spectral Doppler, color Doppler, and power Doppler as needed of all accessible portions of each vessel. Bilateral testing is considered an integral part of a complete examination. Limited examinations for reoccurring indications may be performed as noted.  Right Carotid Findings: +----------+--------+-------+--------+----------------------+------------------+           PSV cm/sEDV    StenosisPlaque Description    Comments                             cm/s                                                     +----------+--------+-------+--------+----------------------+------------------+ CCA Prox  105     11             smooth and  intimal thickening                                  heterogenous                             +----------+--------+-------+--------+----------------------+------------------+ CCA Distal84      14                                   intimal thickening +----------+--------+-------+--------+----------------------+------------------+ ICA Prox  67      15     1-39%   calcific                                 +----------+--------+-------+--------+----------------------+------------------+ ICA Distal60      14                                                      +----------+--------+-------+--------+----------------------+------------------+ ECA       61      8                                                       +----------+--------+-------+--------+----------------------+------------------+ +----------+--------+-------+----------------+-------------------+           PSV cm/sEDV cmsDescribe        Arm Pressure (mmHG) +----------+--------+-------+----------------+-------------------+ [id number]             Multiphasic, WNL                    +----------+--------+-------+----------------+-------------------+ +---------+--------+--+--------+--+---------+ VertebralPSV cm/s51EDV cm/s13Antegrade +---------+--------+--+--------+--+---------+  Left Carotid Findings: +----------+--------+--------+--------+------------------+---------+           PSV cm/sEDV cm/sStenosisPlaque DescriptionComments  +----------+--------+--------+--------+------------------+---------+ CCA Prox  105     17                                          +----------+--------+--------+--------+------------------+---------+ CCA Distal82      13              heterogenous                 +----------+--------+--------+--------+------------------+---------+ ICA Prox  73      17                                Shadowing +----------+--------+--------+--------+------------------+---------+ ICA Distal94      20                                          +----------+--------+--------+--------+------------------+---------+ ECA       78      0                                           +----------+--------+--------+--------+------------------+---------+ +----------+--------+--------+----------------+-------------------+  PSV cm/sEDV cm/sDescribe        Arm Pressure (mmHG) +----------+--------+--------+----------------+-------------------+ Subclavian160             Multiphasic, WNL                    +----------+--------+--------+----------------+-------------------+ +---------+--------+--+--------+--+---------+ VertebralPSV cm/s50EDV cm/s11Antegrade +---------+--------+--+--------+--+---------+   Summary: Right Carotid: Velocities in the right ICA are consistent with a 1-39% stenosis.                The CCA and ECA were near-normal with only minimal wall                thickening or plaque. Left Carotid: The extracranial vessels were near-normal with only minimal wall               thickening or plaque. Vertebrals:  Bilateral vertebral arteries demonstrate antegrade flow. Subclavians: Normal flow hemodynamics were seen in bilateral subclavian              arteries. *See table(s) above for measurements and observations.  Electronically signed by Gerarda Fraction on 12/17/2020 at 10:36:23 AM.    Final         Scheduled Meds:  acyclovir  400 mg Oral BID   atorvastatin  10 mg Oral Daily   efavirenz-emtricitabine-tenofovir  1 tablet Oral Daily   [START ON 12/19/2020] enoxaparin  60 mg Subcutaneous Q24H   feeding supplement  237 mL Oral BID BM   insulin aspart  0-9 Units Subcutaneous Q4H   multivitamin with minerals  1 tablet Oral Daily   Continuous  Infusions:  sodium chloride Stopped (12/17/20 1616)    ceFAZolin (ANCEF) IV 2 g (12/18/20 1432)   methocarbamol (ROBAXIN) IV       LOS: 2 days  I spent additional 30 minutes in management, planning, and care coordination.  Darlin Drop, MD [facility]   To contact the attending provider between 7A-7P or the covering provider during after hours 7P-7A, please log into the web site [email or web address] and access using universal [place] password for that web site. If you do not have the password, please call the hospital operator.  12/18/2020, 4:09 PM

## 2020-12-18 NOTE — Plan of Care (Signed)
  Problem: Activity: Goal: Ability to return to normal activity level will improve to the fullest extent possible by discharge Outcome: Progressing   Problem: Coping: Goal: Family members realistic understanding of the patients condition will improve by discharge Outcome: Progressing

## 2020-12-18 NOTE — Op Note (Addendum)
12/18/2020  11:05 AM  PATIENT:  Helen James    PRE-OPERATIVE DIAGNOSIS:  Right distal femur periprosthetic fracture  POST-OPERATIVE DIAGNOSIS:  Same  PROCEDURE:  OPEN REDUCTION INTERNAL FIXATION (ORIF) DISTAL FEMUR FRACTURE, OPEN REDUCTION INTERNAL FIXATION (ORIF) PERIPROSTHETIC FRACTURE  SURGEON:  Berneda Piccininni A Vivian Neuwirth, MD  PHYSICIAN ASSISTANT: none  ANESTHESIA:   General  PREOPERATIVE INDICATIONS:  Helen James is a  65 y.o. female with a diagnosis of Right distal femur periprosthetic fracture who failed conservative measures and elected for surgical management.    The risks benefits and alternatives were discussed with the patient preoperatively including but not limited to the risks of infection, bleeding, nerve injury, cardiopulmonary complications, the need for revision surgery, among others, and the patient was willing to proceed.  ESTIMATED BLOOD LOSS: 200cc  OPERATIVE IMPLANTS:  Implant Name Type Inv. Item Serial No. Manufacturer Lot No. LRB No. Used Action  SCREW CORTICAL NCB 5.0X90MM - [id number] Screw SCREW CORTICAL NCB 5.0X90MM  [employer]ORTH,TRAU,[employer],SG) [id number] Right 1 Implanted  PLATE FEM DIST NCB PP - [id number] Plate PLATE FEM DIST NCB PP  [employer](ORTH,TRAU,BIO,SG)  Right 1 Implanted  SCREW NCB 5.0X85MM - [id number] Screw SCREW NCB 5.0X85MM  [employer](ORTH,TRAU,BIO,SG)  Right 2 Implanted  SCREW 5.0 - [id number] Screw SCREW 5.0  [employer](ORTH,TRAU,BIO,SG)  Right 2 Implanted  SCREW NCB 5.0X75MM - [id number] Screw SCREW NCB 5.0X75MM  [employer](ORTH,TRAU,BIO,SG)  Right 1 Implanted  SCREW NCB 5.0X36MM - [id number] Screw SCREW NCB 5.0X36MM  [employer](ORTH,TRAU,BIO,SG)  Right 1 Implanted  SCREW NCB 5.0X38 - [id number] Screw SCREW NCB 5.0X38  [employer](ORTH,TRAU,BIO,SG)  Right 2 Implanted  SCREW NCB 4.0MX34M - [id number] Screw SCREW NCB 4.0MX34M  [employer](ORTH,TRAU,BIO,SG)  Right 1 Implanted  CAP LOCK NCB - [id number]  CAP LOCK NCB  [employer](ORTH,TRAU,BIO,SG)  Right 8 Implanted     OPERATIVE FINDINGS: Comminuted periprosthetic distal femur fracture with successful open reduction internal internal fixation with plate and screws.  Surgery complicated by patient's morbid obesity especially large mid and upper thigh increasing the complexity and difficulty of the case, and added significant additional time to the duration of the case.  OPERATIVE PROCEDURE:  The patient was identified in the preoperative holding area. Consent was confirmed with the patient and their family and all questions were answered. The operative extremity was marked after confirmation with the patient. The patient was then brought back to the operating room by our anesthesia colleagues. The patient was placed under general anesthesia and was carefully transferred over to a radiolucent flattop table. They were secured to the bed and all bony prominences were padded.  A bump was placed under the operative hip. The operative extremity was then prepped and draped in usual sterile fashion. A timeout was performed to verify the patient, the procedure and extremity. Preoperative antibiotics were dosed.   Fluoroscopy was used to identify the fracture. A lateral approach was made to the distal femur. It was taken down through the skin and the IT band was incised in line with the incision. The distal portion of the vastus lateralis was reflected off the IT band and the distal articular block of the lateral femoral condyle was exposed.  An attempt was made to keep the fracture from being stripped or devitalized. I used fluoroscopy to identify the correct length plate to bridge the whole femoral shaft and proximal to the tip of the hip prothesis. I chose a 12-hole plate. The aiming arm was attached to the  plate and slide submuscularly under the vastus to the proximal femur. The distal portion of the plate was placed flush against the lateral cortex of the  distal articular block.  The plate was initially fixed with 2 K wires and a nonlocking screw to bring the plate down to the lateral cortex.  Fluoroscopy was used to confirm that the plate was appropriately positioned distally as well as rotated appropriately on the lateral.. A 3.73mm drill bit was then used to position the proximal portion of the plate. A perfect lateral was obtained to show appropriate positioning of the plate.  A proximal cortical screw was used to bring the plate down to bone and correct the coronal alignment.  We were pleased with the reduction in both AP and lateral at this point.   The distal segment was drilled and 5.77mm cortical screws were placed in the distal fracture segment. A total of 6 screws were placed.  Next additional proximal fixation was placed.  A total of four shaft screws were placed to allow for a significant working length and micromotion of the fracture. Locking caps were placed in the shaft screws except for the most proximal screw to prevent a stress riser. The targeting guide was removed. Fluoroscopy was used to confirm adequate reduction of the fracture and appropriate position of the plate and screws.   The incisions were irrigated with normal saline. A gram of vancomycin powder was placed in the incision around the plate. The IT band was closed with 0-vicryl. The skin was closed with 2-vicryl, 3-0 nylon. The incisions were dressed with Adaptic, 4 x 4 gauze, and sterile cast padding and the leg was wrapped with a ACE wrap. The patient was then transferred to the regular floor bed and taken to the PACU in stable condition.     Post op recs: WB: Touch down weight bearing RLE Abx: ancef x23 hours post op Imaging: PACU xrays Dressing: keep intact until follow up, change PRN if soiled or saturated. DVT prophylaxis: lovenox starting POD1 x4 weeks Follow up: 2 weeks after surgery for a wound check with Dr. Blanchie Dessert at [facility].  Address: [address], [place], [place] [address]  Office Phone: [phone]  Weber Cooks, MD Orthopaedic Surgery

## 2020-12-18 NOTE — Evaluation (Signed)
Occupational Therapy Evaluation Patient Details Name: Helen James MRN: [id number] DOB: 08-26-1955 Today's Date: 12/18/2020   History of Present Illness This 65 y.o. female admitted with periprosthetic distal femur fracture that occured  after a syncopal event (likely vasovagal) in which pt fell.  She underwent ORIF.  PMH includes: DM, HIV, HTN, morbid obesity, s/p gastric bypass, s/p Rt TKA   Clinical Impression   Pt admitted with above. She demonstrates the below listed deficits and will benefit from continued OT to maximize safety and independence with BADLs.  Pt presents to OT with generalized weakness, impaired balance, decreased awareness of precautions, and pain.  She currently requires max - total A for LB ADLs and min A +2 for sit to stand and simulated pivot tranfers.  She lives alone, and was mod I with ADLs.  She plans to discharge to her daughter's home that has no stairs and should accommodate a w/c.  Will follow acutely.        Recommendations for follow up therapy are one component of a multi-disciplinary discharge planning process, led by the attending physician.  Recommendations may be updated based on patient status, additional functional criteria and insurance authorization.   Follow Up Recommendations  Home health OT    Assistance Recommended at Discharge Frequent or constant Supervision/Assistance  Functional Status Assessment  Patient has had a recent decline in their functional status and demonstrates the ability to make significant improvements in function in a reasonable and predictable amount of time.  Equipment Recommendations  Wheelchair (measurements OT);Wheelchair cushion (measurements OT)    Recommendations for Other Services       Precautions / Restrictions Precautions Precautions: Fall Restrictions Weight Bearing Restrictions: Yes RLE Weight Bearing: Touchdown weight bearing      Mobility Bed Mobility                     Transfers Overall transfer level: Needs assistance Equipment used: Rolling walker (2 wheels) Transfers: Sit to/from Stand;Bed to chair/wheelchair/BSC Sit to Stand: Min assist;+2 physical assistance;+2 safety/equipment;From elevated surface Stand pivot transfers: Mod assist;+2 physical assistance;+2 safety/equipment         General transfer comment: simulated pivot toward Lt.  requires assist to maintain TDWB and assist to balance      Balance Overall balance assessment: Needs assistance Sitting-balance support: Feet supported Sitting balance-Leahy Scale: Good     Standing balance support: Reliant on assistive device for balance Standing balance-Leahy Scale: Poor                             ADL either performed or assessed with clinical judgement   ADL Overall ADL's : Needs assistance/impaired Eating/Feeding: Independent   Grooming: Wash/dry hands;Wash/dry face;Oral care;Supervision/safety;Set up;Sitting   Upper Body Bathing: Set up;Supervision/ safety;Sitting   Lower Body Bathing: Moderate assistance;Bed level;Sit to/from stand   Upper Body Dressing : Set up;Supervision/safety;Sitting   Lower Body Dressing: Total assistance;Sit to/from stand   Toilet Transfer: Total assistance Toilet Transfer Details (indicate cue type and reason): unable this date Toileting- Clothing Manipulation and Hygiene: Total assistance;Sit to/from stand       Functional mobility during ADLs: Minimal assistance;+2 for physical assistance;+2 for safety/equipment;Rolling walker (2 wheels)       Vision         Perception     Praxis      Pertinent Vitals/Pain Pain Assessment: 0-10 Pain Score: 2  Pain Location: Rt knee Pain Descriptors / Indicators:  Operative site guarding Pain Intervention(s): Monitored during session     Hand Dominance Right   Extremity/Trunk Assessment Upper Extremity Assessment Upper Extremity Assessment: Overall WFL for tasks assessed    Lower Extremity Assessment Lower Extremity Assessment: Overall WFL for tasks assessed   Cervical / Trunk Assessment Cervical / Trunk Assessment: Normal   Communication Communication Communication: No difficulties   Cognition Arousal/Alertness: Awake/alert Behavior During Therapy: WFL for tasks assessed/performed Overall Cognitive Status: Within Functional Limits for tasks assessed                                       General Comments  VSS.  Daughter present    Exercises     Shoulder Instructions      Home Living Family/patient expects to be discharged to:: Private residence Living Arrangements: Children Available Help at Discharge: Family;Available 24 hours/day Type of Home: House Home Access: Level entry     Home Layout: One level     Bathroom Shower/Tub: Tub/shower unit;Curtain   Bathroom Toilet: Handicapped height     Home Equipment: Rollator (4 wheels);Cane - single point;BSC/3in1;Shower seat;Other (comment) (just bought grab bars but not yet had them installed)   Additional Comments: Pt will discharge to her daughter's home and thinks it will accommodate a w/c      Prior Functioning/Environment Prior Level of Function : Needs assist;Driving       Physical Assist : ADLs (physical)   ADLs (physical): IADLs Mobility Comments: ambulated with rollator          OT Problem List: Decreased strength;Decreased activity tolerance;Impaired balance (sitting and/or standing);Decreased knowledge of use of DME or AE;Obesity;Pain      OT Treatment/Interventions: Self-care/ADL training;DME and/or AE instruction;Therapeutic activities;Patient/family education;Balance training    OT Goals(Current goals can be found in the care plan section) Acute Rehab OT Goals Patient Stated Goal: to get back to normal OT Goal Formulation: With patient/family Time For Goal Achievement: 12/31/20 Potential to Achieve Goals: Good ADL Goals Pt Will Perform Lower  Body Bathing: with min guard assist;sit to/from stand;with adaptive equipment Pt Will Perform Lower Body Dressing: with min guard assist;with adaptive equipment;sit to/from stand Pt Will Transfer to Toilet: with min guard assist;stand pivot transfer;bedside commode Pt Will Perform Toileting - Clothing Manipulation and hygiene: with min guard assist;sit to/from stand Pt Will Perform Tub/Shower Transfer: Tub transfer;Stand pivot transfer;tub bench;rolling walker  OT Frequency: Min 3X/week   Barriers to D/C:            Co-evaluation PT/OT/SLP Co-Evaluation/Treatment: Yes Reason for Co-Treatment: For patient/therapist safety;To address functional/ADL transfers   OT goals addressed during session: ADL's and self-care      AM-PAC OT "6 Clicks" Daily Activity     Outcome Measure Help from another person eating meals?: None Help from another person taking care of personal grooming?: A Little Help from another person toileting, which includes using toliet, bedpan, or urinal?: A Lot Help from another person bathing (including washing, rinsing, drying)?: A Lot Help from another person to put on and taking off regular upper body clothing?: A Little Help from another person to put on and taking off regular lower body clothing?: Total 6 Click Score: 15   End of Session Equipment Utilized During Treatment: Rolling walker (2 wheels) Nurse Communication: Mobility status  Activity Tolerance: Patient tolerated treatment well Patient left: in bed;with call bell/phone within reach;with family/visitor present  OT Visit Diagnosis: Unsteadiness  on feet (R26.81);Pain Pain - Right/Left: Right Pain - part of body: Knee                Time: 5284-1324 OT Time Calculation (min): 43 min Charges:  OT General Charges $OT Visit: 1 Visit OT Evaluation $OT Eval Moderate Complexity: 1 Mod OT Treatments $Therapeutic Activity: 8-22 mins  Eber Jones., OTR/L Acute Rehabilitation Services Pager  [phone] Office [phone]   Jeani Hawking M 12/18/2020, 4:42 PM

## 2020-12-18 NOTE — Evaluation (Signed)
Physical Therapy Evaluation Patient Details Name: Helen James MRN: [id number] DOB: 05/27/1955 Today's Date: 12/18/2020  History of Present Illness  This 65 y.o. female admitted with periprosthetic distal femur fracture that occured  after a syncopal event (likely vasovagal) in which pt fell.  She underwent ORIF.  PMH includes: DM, HIV, HTN, morbid obesity, s/p gastric bypass, s/p Rt TKA  Clinical Impression  Patient admitted with above diagnosis. Patient presents with generalized weakness (R>L), impaired balance, decreased activity tolerance, decreased awareness of precautions, and pain. Patient with difficulty maintaining TWB on R LE at times but with cues was able to correct for short time period. Patient requires minA+2 for sit to stand and simulated pivot transfer. Patient plans to discharge to daughter's home with no stairs and should accommodate for a w/c. Patient will benefit from skilled PT services during acute stay to address listed deficits. Recommend HHPT at discharge to maximize functional mobility and safety.        Recommendations for follow up therapy are one component of a multi-disciplinary discharge planning process, led by the attending physician.  Recommendations may be updated based on patient status, additional functional criteria and insurance authorization.  Follow Up Recommendations Home health PT    Assistance Recommended at Discharge Frequent or constant Supervision/Assistance  Functional Status Assessment Patient has had a recent decline in their functional status and demonstrates the ability to make significant improvements in function in a reasonable and predictable amount of time.  Equipment Recommendations  Rolling Zaia Carre (2 wheels);Wheelchair (measurements PT);Wheelchair cushion (measurements PT);BSC/3in1    Recommendations for Other Services       Precautions / Restrictions Precautions Precautions: Fall Restrictions Weight Bearing Restrictions:  Yes RLE Weight Bearing: Touchdown weight bearing      Mobility  Bed Mobility Overal bed mobility: Needs Assistance Bed Mobility: Supine to Sit;Sit to Supine     Supine to sit: +2 for safety/equipment;Mod assist Sit to supine: Mod assist;+2 for safety/equipment   General bed mobility comments: patient able to advance R LE with therapist offweighting from mattress. ModA to complete bringing LEs off bed and trunk elevation. Patient able to return to supine with assist for LE and trunk management    Transfers Overall transfer level: Needs assistance Equipment used: Rolling Emillee Talsma (2 wheels) Transfers: Sit to/from Stand;Bed to chair/wheelchair/BSC Sit to Stand: Min assist;+2 physical assistance;+2 safety/equipment;From elevated surface Stand pivot transfers: Mod assist;+2 physical assistance;+2 safety/equipment         General transfer comment: simulated pivot toward Lt.  requires assist to maintain TDWB and assist to balance    Ambulation/Gait                  Stairs            Wheelchair Mobility    Modified Rankin (Stroke Patients Only)       Balance Overall balance assessment: Needs assistance Sitting-balance support: Feet supported Sitting balance-Leahy Scale: Good     Standing balance support: Reliant on assistive device for balance Standing balance-Leahy Scale: Poor                               Pertinent Vitals/Pain Pain Assessment: 0-10 Pain Score: 2  Pain Location: Rt knee Pain Descriptors / Indicators: Operative site guarding Pain Intervention(s): Monitored during session    Home Living Family/patient expects to be discharged to:: Private residence Living Arrangements: Children Available Help at Discharge: Family;Available 24 hours/day Type of Home:  House Home Access: Level entry       Home Layout: One level Home Equipment: Rollator (4 wheels);Cane - single point;BSC/3in1;Shower seat;Other (comment) (just bought grab  bars but not yet had them installed) Additional Comments: Pt will discharge to her daughter's home and thinks it will accommodate a w/c    Prior Function Prior Level of Function : Needs assist;Driving       Physical Assist : ADLs (physical)   ADLs (physical): IADLs Mobility Comments: ambulated with rollator       Hand Dominance   Dominant Hand: Right    Extremity/Trunk Assessment   Upper Extremity Assessment Upper Extremity Assessment: Overall WFL for tasks assessed    Lower Extremity Assessment Lower Extremity Assessment: Generalized weakness (post op weakness. Awaiting L TKA - crepitus noted with mobility)    Cervical / Trunk Assessment Cervical / Trunk Assessment: Normal  Communication   Communication: No difficulties  Cognition Arousal/Alertness: Awake/alert Behavior During Therapy: WFL for tasks assessed/performed Overall Cognitive Status: Within Functional Limits for tasks assessed                                          General Comments General comments (skin integrity, edema, etc.): VSS.  Daughter present    Exercises     Assessment/Plan    PT Assessment Patient needs continued PT services  PT Problem List Decreased strength;Decreased activity tolerance;Decreased balance;Decreased mobility;Decreased knowledge of use of DME;Decreased knowledge of precautions       PT Treatment Interventions DME instruction;Gait training;Functional mobility training;Therapeutic activities;Therapeutic exercise;Balance training;Patient/family education;Wheelchair mobility training    PT Goals (Current goals can be found in the Care Plan section)  Acute Rehab PT Goals Patient Stated Goal: to get stronger and go home PT Goal Formulation: With patient Time For Goal Achievement: 01/01/21 Potential to Achieve Goals: Good    Frequency Min 5X/week   Barriers to discharge        Co-evaluation PT/OT/SLP Co-Evaluation/Treatment: Yes Reason for  Co-Treatment: For patient/therapist safety;To address functional/ADL transfers PT goals addressed during session: Mobility/safety with mobility;Balance;Proper use of DME OT goals addressed during session: ADL's and self-care       AM-PAC PT "6 Clicks" Mobility  Outcome Measure Help needed turning from your back to your side while in a flat bed without using bedrails?: Total Help needed moving from lying on your back to sitting on the side of a flat bed without using bedrails?: Total Help needed moving to and from a bed to a chair (including a wheelchair)?: Total Help needed standing up from a chair using your arms (e.g., wheelchair or bedside chair)?: Total Help needed to walk in hospital room?: Total Help needed climbing 3-5 steps with a railing? : Total 6 Click Score: 6    End of Session   Activity Tolerance: Patient tolerated treatment well Patient left: in bed;with call bell/phone within reach;with family/visitor present Nurse Communication: Mobility status PT Visit Diagnosis: Unsteadiness on feet (R26.81);History of falling (Z91.81);Muscle weakness (generalized) (M62.81);Other abnormalities of gait and mobility (R26.89)    Time: 1157-2620 PT Time Calculation (min) (ACUTE ONLY): 37 min   Charges:   PT Evaluation $PT Eval Moderate Complexity: 1 Mod PT Treatments $Therapeutic Activity: 8-22 mins        Andon Villard A. Dan Humphreys PT, DPT Acute Rehabilitation Services Pager [phone] Office [phone]   Viviann Spare 12/18/2020, 5:25 PM

## 2020-12-18 NOTE — Plan of Care (Signed)
  Problem: Activity: Goal: Ability to return to normal activity level will improve to the fullest extent possible by discharge Outcome: Progressing   Problem: Education: Goal: Knowledge of medication regimen will be met for pain relief regimen by discharge Outcome: Progressing Goal: Understanding of ways to prevent infection will improve by discharge Outcome: Progressing   Problem: Coping: Goal: Ability to verbalize feelings will improve by discharge Outcome: Progressing Goal: Family members realistic understanding of the patients condition will improve by discharge Outcome: Progressing   Problem: Fluid Volume: Goal: Maintenance of adequate hydration will improve by discharge Outcome: Progressing   Problem: Medication: Goal: Compliance with prescribed medication regimen will improve by discharge Outcome: Progressing   Problem: Physical Regulation: Goal: Hemodynamic stability will return to baseline for the patient by discharge Outcome: Progressing Goal: Diagnostic test results will improve Outcome: Progressing Goal: Will remain free from infection Outcome: Progressing   Problem: Respiratory: Goal: Ability to maintain adequate oxygenation and ventilation will improve by discharge Outcome: Progressing   Problem: Role Relationship: Goal: Ability to identify and utilize available support systems will improve by discharge Outcome: Progressing   Problem: Pain Management: Goal: Satisfaction with pain management regimen will be met by discharge Outcome: Progressing

## 2020-12-19 DIAGNOSIS — R55 Syncope and collapse: Secondary | ICD-10-CM | POA: Diagnosis not present

## 2020-12-19 LAB — CBC
HCT: 21 % — ABNORMAL LOW (ref 36.0–46.0)
HCT: 22.2 % — ABNORMAL LOW (ref 36.0–46.0)
Hemoglobin: 7.3 g/dL — ABNORMAL LOW (ref 12.0–15.0)
Hemoglobin: 7.7 g/dL — ABNORMAL LOW (ref 12.0–15.0)
MCH: 33.5 pg (ref 26.0–34.0)
MCH: 33.8 pg (ref 26.0–34.0)
MCHC: 34.8 g/dL (ref 30.0–36.0)
MCHC: 34.7 g/dL (ref 30.0–36.0)
MCV: 96.3 fL (ref 80.0–100.0)
MCV: 97.4 fL (ref 80.0–100.0)
Platelets: 140 10*3/uL — ABNORMAL LOW (ref 150–400)
Platelets: 142 10*3/uL — ABNORMAL LOW (ref 150–400)
RBC: 2.18 MIL/uL — ABNORMAL LOW (ref 3.87–5.11)
RBC: 2.28 MIL/uL — ABNORMAL LOW (ref 3.87–5.11)
RDW: 12.3 % (ref 11.5–15.5)
RDW: 12.5 % (ref 11.5–15.5)
WBC: 6.8 10*3/uL (ref 4.0–10.5)
WBC: 8 10*3/uL (ref 4.0–10.5)
nRBC: 0 % (ref 0.0–0.2)
nRBC: 0 % (ref 0.0–0.2)

## 2020-12-19 LAB — GLUCOSE, CAPILLARY
Glucose-Capillary: 108 mg/dL — ABNORMAL HIGH (ref 70–99)
Glucose-Capillary: 111 mg/dL — ABNORMAL HIGH (ref 70–99)
Glucose-Capillary: 119 mg/dL — ABNORMAL HIGH (ref 70–99)
Glucose-Capillary: 128 mg/dL — ABNORMAL HIGH (ref 70–99)
Glucose-Capillary: 131 mg/dL — ABNORMAL HIGH (ref 70–99)
Glucose-Capillary: 135 mg/dL — ABNORMAL HIGH (ref 70–99)
Glucose-Capillary: 161 mg/dL — ABNORMAL HIGH (ref 70–99)

## 2020-12-19 LAB — COMPREHENSIVE METABOLIC PANEL
ALT: 15 U/L (ref 0–44)
AST: 26 U/L (ref 15–41)
Albumin: 2.5 g/dL — ABNORMAL LOW (ref 3.5–5.0)
Alkaline Phosphatase: 91 U/L (ref 38–126)
Anion gap: 6 (ref 5–15)
BUN: 11 mg/dL (ref 8–23)
CO2: 18 mmol/L — ABNORMAL LOW (ref 22–32)
Calcium: 7.9 mg/dL — ABNORMAL LOW (ref 8.9–10.3)
Chloride: 113 mmol/L — ABNORMAL HIGH (ref 98–111)
Creatinine, Ser: 0.91 mg/dL (ref 0.44–1.00)
GFR, Estimated: >60 mL/min (ref >60)
Glucose, Bld: 126 mg/dL — ABNORMAL HIGH (ref 70–99)
Potassium: 3.9 mmol/L (ref 3.5–5.1)
Sodium: 137 mmol/L (ref 135–145)
Total Bilirubin: 0.3 mg/dL (ref 0.3–1.2)
Total Protein: 5 g/dL — ABNORMAL LOW (ref 6.5–8.1)

## 2020-12-19 MED ORDER — SENNOSIDES-DOCUSATE SODIUM 8.6-50 MG PO TABS
2.0000 | ORAL_TABLET | Freq: Two times a day (BID) | ORAL | Status: DC
  Administered 2020-12-19 – 2020-12-21 (×5): 2 via ORAL
  Filled 2020-12-19 (×5): qty 2

## 2020-12-19 MED ORDER — OXYCODONE-ACETAMINOPHEN 7.5-325 MG PO TABS
1.0000 | ORAL_TABLET | ORAL | Status: DC | PRN
  Filled 2020-12-19: qty 1

## 2020-12-19 MED ORDER — SODIUM BICARBONATE 650 MG PO TABS
1300.0000 mg | ORAL_TABLET | Freq: Three times a day (TID) | ORAL | Status: DC
  Administered 2020-12-19 (×2): 1300 mg via ORAL
  Filled 2020-12-19 (×2): qty 2

## 2020-12-19 MED ORDER — HYDROCODONE-ACETAMINOPHEN 5-325 MG PO TABS
1.0000 | ORAL_TABLET | ORAL | Status: DC | PRN
  Administered 2020-12-19 – 2020-12-20 (×4): 1 via ORAL
  Administered 2020-12-20: 22:00:00 2 via ORAL
  Administered 2020-12-20: 04:00:00 1 via ORAL
  Filled 2020-12-19 (×4): qty 1
  Filled 2020-12-19: qty 2
  Filled 2020-12-19 (×2): qty 1

## 2020-12-19 NOTE — Progress Notes (Signed)
Physical Therapy Treatment Patient Details Name: Helen James MRN: [id number] DOB: January 09, 1956 Today's Date: 12/19/2020   History of Present Illness This 65 y.o. female admitted with periprosthetic distal femur fracture that occured  after a syncopal event (likely vasovagal) in which pt fell.  She underwent ORIF.  PMH includes: DM, HIV, HTN, morbid obesity, s/p gastric bypass, s/p Rt TKA    PT Comments    Patient progressing very well with bed mobility and transfers to/from [facility]. She is now able to do so with min-guard assist as she "swivels" on her left foot to maintain TDWB on the RLE.   Recommendations for follow up therapy are one component of a multi-disciplinary discharge planning process, led by the attending physician.  Recommendations may be updated based on patient status, additional functional criteria and insurance authorization.  Follow Up Recommendations  Home health PT     Assistance Recommended at Discharge Frequent or constant Supervision/Assistance  Equipment Recommendations  Rolling walker (2 wheels);Wheelchair (measurements PT);Wheelchair cushion (measurements PT);BSC/3in1    Recommendations for Other Services       Precautions / Restrictions Precautions Precautions: Fall Restrictions RLE Weight Bearing: Touchdown weight bearing     Mobility  Bed Mobility Overal bed mobility: Needs Assistance Bed Mobility: Supine to Sit;Sit to Supine     Supine to sit: Min assist;HOB elevated Sit to supine: Min assist   General bed mobility comments: patient comes up to long-sitting and needs min assist to move RLE over EOB; on return to supine, min assist to RLE to raise leg onto bed    Transfers Overall transfer level: Needs assistance Equipment used: Rolling walker (2 wheels) Transfers: Sit to/from Stand;Bed to chair/wheelchair/BSC Sit to Stand: Min assist;+2 safety/equipment;From elevated surface (bed and BSC) Stand pivot transfers: +2 safety/equipment;Min  guard;From elevated surface         General transfer comment: pt pivoted to her rt and then back to her left to get on/off BSC. Pt "shimmies" mostly, but did take 1-2 small steps; pt leans foward onto left forearm on RW with rt hand on walker grip.    Ambulation/Gait                   Stairs             Wheelchair Mobility    Modified Rankin (Stroke Patients Only)       Balance Overall balance assessment: Needs assistance Sitting-balance support: Feet supported Sitting balance-Leahy Scale: Good     Standing balance support: Reliant on assistive device for balance Standing balance-Leahy Scale: Poor                              Cognition Arousal/Alertness: Awake/alert Behavior During Therapy: WFL for tasks assessed/performed Overall Cognitive Status: Within Functional Limits for tasks assessed                                          Exercises      General Comments General comments (skin integrity, edema, etc.): Educated on pro's/con's of a platform attachment for left UE and pt feels she it is not necessary. She did grip crossbar with left hand to more securely keep her left forearm on platform.      Pertinent Vitals/Pain Pain Assessment: Faces Faces Pain Scale: Hurts little more Pain Location: Rt knee Pain Descriptors /  Indicators: Operative site guarding Pain Intervention(s): Limited activity within patient's tolerance;Monitored during session;Premedicated before session    Home Living                          Prior Function            PT Goals (current goals can now be found in the care plan section) Acute Rehab PT Goals Patient Stated Goal: to get stronger and go home Time For Goal Achievement: 01/01/21 Potential to Achieve Goals: Good Progress towards PT goals: Progressing toward goals    Frequency    Min 5X/week      PT Plan Current plan remains appropriate    Co-evaluation PT/OT/SLP  Co-Evaluation/Treatment: Yes Reason for Co-Treatment: For patient/therapist safety;To address functional/ADL transfers PT goals addressed during session: Mobility/safety with mobility;Balance;Proper use of DME        AM-PAC PT "6 Clicks" Mobility   Outcome Measure  Help needed turning from your back to your side while in a flat bed without using bedrails?: A Little Help needed moving from lying on your back to sitting on the side of a flat bed without using bedrails?: A Little Help needed moving to and from a bed to a chair (including a wheelchair)?: A Little Help needed standing up from a chair using your arms (e.g., wheelchair or bedside chair)?: A Little Help needed to walk in hospital room?: Total Help needed climbing 3-5 steps with a railing? : Total 6 Click Score: 14    End of Session   Activity Tolerance: Patient tolerated treatment well Patient left: in bed;with call bell/phone within reach;with family/visitor present Nurse Communication: Mobility status;Other (comment) (can pivot to use BSC) PT Visit Diagnosis: Unsteadiness on feet (R26.81);History of falling (Z91.81);Muscle weakness (generalized) (M62.81);Other abnormalities of gait and mobility (R26.89)     Time: 6767-2094 PT Time Calculation (min) (ACUTE ONLY): 29 min  Charges:  $Gait Training: 8-22 mins                      Jerolyn Center, PT Acute Rehabilitation Services  Pager [phone] Office [phone]    Zena Amos 12/19/2020, 1:50 PM

## 2020-12-19 NOTE — Plan of Care (Signed)
  Problem: Activity: Goal: Ability to return to normal activity level will improve to the fullest extent possible by discharge Outcome: Progressing   

## 2020-12-19 NOTE — Progress Notes (Signed)
   ORTHOPAEDIC PROGRESS NOTE  s/p Procedure(s): OPEN REDUCTION INTERNAL FIXATION (ORIF) DISTAL FEMUR FRACTURE OPEN REDUCTION INTERNAL FIXATION (ORIF) PERIPROSTHETIC FRACTURE  SUBJECTIVE: Reports mild pain about operative site. Worked well with therapy yesterday. No chest pain. No SOB. No nausea/vomiting. No other complaints.  OBJECTIVE: PE:  Vitals:   12/19/20 0413 12/19/20 0752  BP: (!) 109/50 (!) 103/51  Pulse: 91 84  Resp: 16 14  Temp: 99.3 F (37.4 C) 98.8 F (37.1 C)  SpO2: 96% 95%     ASSESSMENT: Keaunna A Better is a 65 y.o. female doing well postoperatively. POD#1  PLAN: Weightbearing: TDWB RLE Insicional and dressing care: Reinforce dressings as needed new mepilex dressings were placed today.  Orthopedic device(s): None DVT prophylaxis: lovenox starting POD1 x4 weeks Pain control: will switch to Norco as she has been intolerant of percocet in the past Follow up: 2 weeks after surgery for a wound check with Dr. Blanchie Dessert at [facility].   Contact information:  After hours and holidays please check [email or web address] for group call information for [facility]   Alfonse Alpers, PA-C 12/19/2020

## 2020-12-19 NOTE — Progress Notes (Signed)
Occupational Therapy Treatment Patient Details Name: Helen James MRN: [id number] DOB: Jan 25, 1955 Today's Date: 12/19/2020   History of present illness This 65 y.o. female admitted with periprosthetic distal femur fracture that occured  after a syncopal event (likely vasovagal) in which pt fell.  She underwent ORIF.  PMH includes: DM, HIV, HTN, morbid obesity, s/p gastric bypass, s/p Rt TKA   OT comments  Pt is making excellent progress toward goals.  She is able to complete transfer to [facility] with min A +2 for safety via stand pivot transfer.  She plans to use w/c in her home due to pain Lt knee and inability to hop given TDWB status on Rt.  She has excellent family support at discharge.  Recommend HHOT.    Recommendations for follow up therapy are one component of a multi-disciplinary discharge planning process, led by the attending physician.  Recommendations may be updated based on patient status, additional functional criteria and insurance authorization.    Follow Up Recommendations  Home health OT    Assistance Recommended at Discharge Frequent or constant Supervision/Assistance  Equipment Recommendations  Wheelchair (measurements OT);Wheelchair cushion (measurements OT)    Recommendations for Other Services      Precautions / Restrictions Precautions Precautions: Fall Restrictions Weight Bearing Restrictions: Yes RLE Weight Bearing: Touchdown weight bearing       Mobility Bed Mobility Overal bed mobility: Needs Assistance Bed Mobility: Supine to Sit;Sit to Supine     Supine to sit: Min assist;HOB elevated Sit to supine: Min assist   General bed mobility comments: patient comes up to long-sitting and needs min assist to move RLE over EOB; on return to supine, min assist to RLE to raise leg onto bed    Transfers Overall transfer level: Needs assistance Equipment used: Rolling walker (2 wheels) Transfers: Sit to/from Stand;Bed to chair/wheelchair/BSC Sit to  Stand: Min assist;+2 safety/equipment;From elevated surface (bed and BSC) Stand pivot transfers: +2 safety/equipment;Min guard;From elevated surface         General transfer comment: pt pivoted to her rt and then back to her left to get on/off BSC. Pt "shimmies" mostly, but did take 1-2 small steps; pt leans foward onto left forearm on RW with rt hand on walker grip.     Balance Overall balance assessment: Needs assistance Sitting-balance support: Feet supported Sitting balance-Leahy Scale: Good     Standing balance support: Reliant on assistive device for balance Standing balance-Leahy Scale: Poor                             ADL either performed or assessed with clinical judgement   ADL Overall ADL's : Needs assistance/impaired                         Toilet Transfer: Stand-pivot;BSC/3in1;+2 for safety/equipment;Minimal assistance   Toileting- Clothing Manipulation and Hygiene: Moderate assistance;Sit to/from stand Toileting - Clothing Manipulation Details (indicate cue type and reason): Pt with difficulty fully accessing peri area on BSC, unable to access fully in standing while maintaining TDWB.  She reports family will assist her as necessary   Tub/Shower Transfer Details (indicate cue type and reason): pt inquiring about a tub transfer bench, but instructed her that we will defer to [facility] as I am unsure if she will be able to access her bathroom via w/c.  She agrees Functional mobility during ADLs: +2 for safety/equipment;Rolling walker (2 wheels);Minimal assistance General ADL Comments: Pt  reports she plans to wear a house dress at home for ease of management.  She anticipates she will sponge bathe at discharge, and is able to don socks with min A in long sitting    Extremity/Trunk Assessment Upper Extremity Assessment Upper Extremity Assessment: Overall WFL for tasks assessed   Lower Extremity Assessment Lower Extremity Assessment: Defer to PT  evaluation        Vision       Perception     Praxis      Cognition Arousal/Alertness: Awake/alert Behavior During Therapy: WFL for tasks assessed/performed Overall Cognitive Status: Within Functional Limits for tasks assessed                                            Exercises     Shoulder Instructions       General Comments Educated on pro's/con's of a platform attachment for left UE and pt feels she it is not necessary. She did grip crossbar with left hand to more securely keep her left forearm on platform.    Pertinent Vitals/ Pain       Pain Assessment: Faces Faces Pain Scale: Hurts little more Pain Location: Rt knee Pain Descriptors / Indicators: Operative site guarding Pain Intervention(s): Monitored during session;Limited activity within patient's tolerance  Home Living                                          Prior Functioning/Environment              Frequency  Min 3X/week        Progress Toward Goals  OT Goals(current goals can now be found in the care plan section)  Progress towards OT goals: Progressing toward goals     Plan Discharge plan remains appropriate    Co-evaluation    PT/OT/SLP Co-Evaluation/Treatment: Yes Reason for Co-Treatment: For patient/therapist safety;To address functional/ADL transfers PT goals addressed during session: Mobility/safety with mobility;Balance;Proper use of DME OT goals addressed during session: ADL's and self-care      AM-PAC OT "6 Clicks" Daily Activity     Outcome Measure   Help from another person eating meals?: None Help from another person taking care of personal grooming?: A Little Help from another person toileting, which includes using toliet, bedpan, or urinal?: A Lot Help from another person bathing (including washing, rinsing, drying)?: A Lot Help from another person to put on and taking off regular upper body clothing?: A Little Help from another  person to put on and taking off regular lower body clothing?: A Lot 6 Click Score: 16    End of Session Equipment Utilized During Treatment: Rolling walker (2 wheels)  OT Visit Diagnosis: Unsteadiness on feet (R26.81);Pain Pain - Right/Left: Right Pain - part of body: Knee   Activity Tolerance Patient tolerated treatment well   Patient Left in bed;with call bell/phone within reach;with family/visitor present   Nurse Communication Mobility status        Time: 3875-6433 OT Time Calculation (min): 27 min  Charges: OT General Charges $OT Visit: 1 Visit OT Treatments $Self Care/Home Management : 8-22 mins  Eber Jones OTR/L Acute Rehabilitation Services Pager [phone] Office [phone]   Jeani Hawking M 12/19/2020, 2:20 PM

## 2020-12-19 NOTE — Progress Notes (Signed)
RN stated L EJ IV was placed yesterday in surgery with no documentation. Removed without incident. Dressed with vaseline gauze and tegaderm. Instructed pt. to watch for bleeding.

## 2020-12-19 NOTE — Progress Notes (Signed)
PROGRESS NOTE    Helen James  NOT:[id number] DOB: [birth year] DOA: 12/16/2020 PCP: System, Provider Not In    Chief Complaint  Patient presents with   Leg Pain   Loss of Consciousness    Brief Narrative:  Helen James is a 65 yo w/PMHx of morbid obesity, HIV, DM2, HTN who presents w/ right distal femur fracture in the setting of vasovagal syncope.  She felt nauseous after eating her Thanksgiving meal and got up very quickly in order to make it to the bathroom in time to vomit.  Subsequently she felt dizzy then fell, lost consciousness for about 2 minutes, witnessed by her daughter.  No tongue biting, no incontinence of stool or urine.    POD #1 post right distal femur periprosthetic fracture repair on 12/19/2018 by orthopedic surgery Dr. Blanchie Dessert.  Seen by PT OT with recommendation for home health PT OT.  12/19/2020: Patient was seen and examined at her bedside with her son present in the room.  She reports pain in her right lower extremity.  Pain management in place.   Assessment & Plan:   Principal Problem:   Syncope Active Problems:   HIV disease (HCC)   H/O gastric bypass   HTN (hypertension)   HSV infection   Anemia   Syncope, vasovagal   Femoral distal fracture (HCC)  Periprosthetic right distal femoral fracture, s/p repair on 12/18/2020 by orthopedic surgery Dr. Blanchie Dessert Comminuted displaced periprosthetic distal right femoral fracture post syncope and fall. Appreciate orthopedic surgery's assistance Continue pain control Continue subcu Lovenox daily for pharmacological DVT prophylaxis  Vasovagal Syncope  Occurred in the setting of nausea - Cardiology evaluated: TTE WNL, no further workup Orthostatic vital signs when able to stand. - unlikely to be PE, if HR or O2 status changes, can consider eval  Acute blood loss anemia postsurgery Hemoglobin this morning 7.3, repeat 7.7 No overt bleeding, presented with hemoglobin of 10.8 Repeat CBC in the  morning  Pancytopenia in the setting of acute illness and HIV All 3 cell lines are down Repeat CBC in the morning Obtain CD4 count and viral load Will need follow-up appointment with infectious disease  Elevated D-dimer in the setting of comminuted displaced periprosthetic distal right femoral fracture  D-dimer greater than 10 Closely monitor for any acute changes. Repeat D-dimer on 12/20/2020.  Non anion gap metabolic acidosis Serum bicarb 18, anion gap 6 Start sodium bicarb 1300 mg 3 times daily x2 days. Repeat BMP in the morning  HTN (hypertension)  Maintain MAP greater than 65. Home oral antihypertensives held due to soft BPs. Continue to closely monitor vital signs.    HSV infection -stable continue home medication   HIV disease (HCC)  Continue home regimen.      DVT prophylaxis: lovenox subcu daily Code Status: full  Disposition:   Status is: Inpatient  Remains inpatient appropriate because: she needs surgery   Consultants:  Orthopedics  Procedures: n/a  Antimicrobials: n/a    Objective: Vitals:   12/18/20 2000 12/19/20 0413 12/19/20 0752 12/19/20 1218  BP: 134/79 (!) 109/50 (!) 103/51 (!) 107/45  Pulse: 90 91 84 78  Resp: (!) 21 16 14 16   Temp: 99.3 F (37.4 C) 99.3 F (37.4 C) 98.8 F (37.1 C) 98.2 F (36.8 C)  TempSrc: Oral Oral Oral Oral  SpO2: 93% 96% 95% 100%  Weight:      Height:        Intake/Output Summary (Last 24 hours) at 12/19/2020 1636 Last data filed at 12/19/2020  0407 Gross per 24 hour  Intake 987.41 ml  Output 800 ml  Net 187.41 ml   Filed Weights   12/16/20 1820  Weight: 110.7 kg    Examination:  General exam: Well-developed well-nourished in no acute distress.  She is alert and oriented x3.   Respiratory system: Clear to auscultation with no wheezes or rales.   Cardiovascular system: Regular rate and rhythm no rubs or gallops.   Gastrointestinal system: Soft nontender normal bowel sounds present.   Central  nervous system: She is alert and oriented x3. Extremities: Right knee in surgical dressing.  Able to move all 4 extremities. Skin: No rashes or ulcerative lesions noted.   Neuro: Can wiggle all her toes. Psychiatry: Mood is appropriate for condition and setting.   Data Reviewed: I have personally reviewed following labs and imaging studies  CBC: Recent Labs  Lab 12/16/20 2143 12/17/20 1439 12/19/20 0754 12/19/20 0932  WBC 5.6 6.0 6.8 8.0  NEUTROABS 3.1  --   --   --   HGB 10.8* 9.9* 7.3* 7.7*  HCT 33.0* 29.4* 21.0* 22.2*  MCV 100.6* 99.7 96.3 97.4  PLT 191 193 140* 142*    Basic Metabolic Panel: Recent Labs  Lab 12/16/20 2143 12/16/20 2330 12/17/20 0500 12/19/20 0754  NA 140  --  139 137  K 3.9  --  4.1 3.9  CL 115*  --  113* 113*  CO2 18*  --  18* 18*  GLUCOSE 157*  --  123* 126*  BUN 20  --  19 11  CREATININE 0.97  --  0.84 0.91  CALCIUM 8.4*  --  8.4* 7.9*  MG  --  2.1  --   --   PHOS  --  3.0  --   --     GFR: Estimated Creatinine Clearance: 72.7 mL/min (by C-G formula based on SCr of 0.91 mg/dL).  Liver Function Tests: Recent Labs  Lab 12/16/20 2143 12/19/20 0754  AST 26 26  ALT 19 15  ALKPHOS 162* 91  BILITOT 0.2* 0.3  PROT 6.2* 5.0*  ALBUMIN 3.1* 2.5*    CBG: Recent Labs  Lab 12/18/20 2028 12/18/20 2327 12/19/20 0409 12/19/20 0758 12/19/20 1220  GLUCAP 130* 141* 135* 128* 161*     Recent Results (from the past 240 hour(s))  Resp Panel by RT-PCR (Flu A&B, Covid) Nasopharyngeal Swab     Status: None   Collection Time: 12/16/20  8:43 PM   Specimen: Nasopharyngeal Swab; Nasopharyngeal(NP) swabs in vial transport medium  Result Value Ref Range Status   SARS Coronavirus 2 by RT PCR NEGATIVE NEGATIVE Final    Comment: (NOTE) SARS-CoV-2 target nucleic acids are NOT DETECTED.  The SARS-CoV-2 RNA is generally detectable in upper respiratory specimens during the acute phase of infection. The lowest concentration of SARS-CoV-2 viral copies  this assay can detect is 138 copies/mL. A negative result does not preclude SARS-Cov-2 infection and should not be used as the sole basis for treatment or other patient management decisions. A negative result may occur with  improper specimen collection/handling, submission of specimen other than nasopharyngeal swab, presence of viral mutation(s) within the areas targeted by this assay, and inadequate number of viral copies(<138 copies/mL). A negative result must be combined with clinical observations, patient history, and epidemiological information. The expected result is Negative.  Fact Sheet for Patients:  BloggerCourse.com  Fact Sheet for Healthcare Providers:  SeriousBroker.it  This test is no t yet approved or cleared by the [place] FDA  and  has been authorized for detection and/or diagnosis of SARS-CoV-2 by FDA under an Emergency Use Authorization (EUA). This EUA will remain  in effect (meaning this test can be used) for the duration of the COVID-19 declaration under Section 564(b)(1) of the Act, 21 U.S.C.section 360bbb-3(b)(1), unless the authorization is terminated  or revoked sooner.       Influenza A by PCR NEGATIVE NEGATIVE Final   Influenza B by PCR NEGATIVE NEGATIVE Final    Comment: (NOTE) The Xpert Xpress SARS-CoV-2/FLU/RSV plus assay is intended as an aid in the diagnosis of influenza from Nasopharyngeal swab specimens and should not be used as a sole basis for treatment. Nasal washings and aspirates are unacceptable for Xpert Xpress SARS-CoV-2/FLU/RSV testing.  Fact Sheet for Patients: BloggerCourse.com  Fact Sheet for Healthcare Providers: SeriousBroker.it  This test is not yet approved or cleared by the [place] FDA and has been authorized for detection and/or diagnosis of SARS-CoV-2 by FDA under an Emergency Use Authorization (EUA). This EUA  will remain in effect (meaning this test can be used) for the duration of the COVID-19 declaration under Section 564(b)(1) of the Act, 21 U.S.C. section 360bbb-3(b)(1), unless the authorization is terminated or revoked.  Performed at [facility], [address] [place], [place] [address]   Surgical pcr screen     Status: None   Collection Time: 12/18/20  3:40 AM   Specimen: Nasal Mucosa; Nasal Swab  Result Value Ref Range Status   MRSA, PCR NEGATIVE NEGATIVE Final   Staphylococcus aureus NEGATIVE NEGATIVE Final    Comment: (NOTE) The Xpert SA Assay (FDA approved for NASAL specimens in patients 36 years of age and older), is one component of a comprehensive surveillance program. It is not intended to diagnose infection nor to guide or monitor treatment. Performed at [facility], [address], [place], [place] [address]          Radiology Studies: DG C-Arm 1-60 Min-No Report  Result Date: 12/18/2020 Fluoroscopy was utilized by the requesting physician.  No radiographic interpretation.   DG FEMUR, MIN 2 VIEWS RIGHT  Result Date: 12/18/2020 CLINICAL DATA:  Postop, right femur ORIF. EXAM: RIGHT FEMUR 2 VIEWS COMPARISON:  12/17/2020. FINDINGS: Submitted images show placement of a long lateral fixation plate extending from the proximal femoral shaft to the distal femur, reducing the primary fracture components, with significantly improved alignment, approximately 5-6 mm of residual medial displacement. The orthopedic hardware is well seated. Knee prosthetic components are normally aligned. IMPRESSION: Successful reduction of the comminuted distal right femur fracture following ORIF. Electronically Signed   By: Amie Portland M.D.   On: 12/18/2020 11:43   DG FEMUR, MIN 2 VIEWS RIGHT  Result Date: 12/18/2020 CLINICAL DATA:  ORIF right femur fracture EXAM: RIGHT FEMUR 2 VIEWS; DG C-ARM 1-60 MIN-NO REPORT COMPARISON:  Right femur radiographs-12/17/2020  FLUOROSCOPY TIME:  3 minutes, 23 seconds (7 mGy) FINDINGS: 7 spot intraoperative fluoroscopic images of the distal aspect of the right femur are provided for review and demonstrate the sequela of sideplate fixation of comminuted distal periprosthetic femur fracture. There is improved alignment of the fracture fragments with persistent mild displacement of the medially located fracture fragment. Stable sequela of previous right total knee replacement, incompletely evaluated. There is expected subcutaneous emphysema about the operative site. No radiopaque foreign body. IMPRESSION: Post sideplate fixation of comminuted distal periprosthetic femur fracture without evidence of complication. Electronically Signed   By: Simonne Come M.D.   On: 12/18/2020  11:06        Scheduled Meds:  acyclovir  400 mg Oral BID   atorvastatin  10 mg Oral Daily   efavirenz-emtricitabine-tenofovir  1 tablet Oral Daily   enoxaparin  60 mg Subcutaneous Q24H   feeding supplement  237 mL Oral BID BM   insulin aspart  0-9 Units Subcutaneous Q4H   multivitamin with minerals  1 tablet Oral Daily   senna-docusate  2 tablet Oral BID   Continuous Infusions:  methocarbamol (ROBAXIN) IV       LOS: 3 days  I spent additional 30 minutes in management, planning, and care coordination.  Darlin Drop, MD [facility]   To contact the attending provider between 7A-7P or the covering provider during after hours 7P-7A, please log into the web site [email or web address] and access using universal [place] password for that web site. If you do not have the password, please call the hospital operator.  12/19/2020, 4:36 PM

## 2020-12-20 ENCOUNTER — Encounter (HOSPITAL_COMMUNITY): Payer: Self-pay | Admitting: Orthopedic Surgery

## 2020-12-20 DIAGNOSIS — R55 Syncope and collapse: Secondary | ICD-10-CM | POA: Diagnosis not present

## 2020-12-20 LAB — BASIC METABOLIC PANEL
Anion gap: 8 (ref 5–15)
BUN: 12 mg/dL (ref 8–23)
CO2: 20 mmol/L — ABNORMAL LOW (ref 22–32)
Calcium: 7.9 mg/dL — ABNORMAL LOW (ref 8.9–10.3)
Chloride: 113 mmol/L — ABNORMAL HIGH (ref 98–111)
Creatinine, Ser: 0.85 mg/dL (ref 0.44–1.00)
GFR, Estimated: >60 mL/min (ref >60)
Glucose, Bld: 102 mg/dL — ABNORMAL HIGH (ref 70–99)
Potassium: 3.9 mmol/L (ref 3.5–5.1)
Sodium: 141 mmol/L (ref 135–145)

## 2020-12-20 LAB — T-HELPER CELLS (CD4) COUNT (NOT AT ARMC)
CD4 % Helper T Cell: 39 % (ref 33–65)
CD4 T Cell Abs: 727 /uL (ref 400–1790)

## 2020-12-20 LAB — CBC
HCT: 19.3 % — ABNORMAL LOW (ref 36.0–46.0)
HCT: 29.9 % — ABNORMAL LOW (ref 36.0–46.0)
Hemoglobin: 6.6 g/dL — CL (ref 12.0–15.0)
Hemoglobin: 9.9 g/dL — ABNORMAL LOW (ref 12.0–15.0)
MCH: 33.2 pg (ref 26.0–34.0)
MCH: 31.7 pg (ref 26.0–34.0)
MCHC: 34.2 g/dL (ref 30.0–36.0)
MCHC: 33.1 g/dL (ref 30.0–36.0)
MCV: 97 fL (ref 80.0–100.0)
MCV: 95.8 fL (ref 80.0–100.0)
Platelets: 140 10*3/uL — ABNORMAL LOW (ref 150–400)
Platelets: 135 10*3/uL — ABNORMAL LOW (ref 150–400)
RBC: 1.99 MIL/uL — ABNORMAL LOW (ref 3.87–5.11)
RBC: 3.12 MIL/uL — ABNORMAL LOW (ref 3.87–5.11)
RDW: 12.3 % (ref 11.5–15.5)
RDW: 14.8 % (ref 11.5–15.5)
WBC: 6.2 10*3/uL (ref 4.0–10.5)
WBC: 8.8 10*3/uL (ref 4.0–10.5)
nRBC: 0 % (ref 0.0–0.2)
nRBC: 0.2 % (ref 0.0–0.2)

## 2020-12-20 LAB — D-DIMER, QUANTITATIVE: D-Dimer, Quant: 3.41 ug/mL-FEU — ABNORMAL HIGH (ref 0.00–0.50)

## 2020-12-20 LAB — GLUCOSE, CAPILLARY
Glucose-Capillary: 113 mg/dL — ABNORMAL HIGH (ref 70–99)
Glucose-Capillary: 130 mg/dL — ABNORMAL HIGH (ref 70–99)
Glucose-Capillary: 105 mg/dL — ABNORMAL HIGH (ref 70–99)
Glucose-Capillary: 143 mg/dL — ABNORMAL HIGH (ref 70–99)
Glucose-Capillary: 123 mg/dL — ABNORMAL HIGH (ref 70–99)
Glucose-Capillary: 122 mg/dL — ABNORMAL HIGH (ref 70–99)

## 2020-12-20 LAB — PREPARE RBC (CROSSMATCH)

## 2020-12-20 MED ORDER — SODIUM BICARBONATE 650 MG PO TABS
1300.0000 mg | ORAL_TABLET | Freq: Four times a day (QID) | ORAL | Status: DC
  Administered 2020-12-20 – 2020-12-21 (×5): 1300 mg via ORAL
  Filled 2020-12-20 (×5): qty 2

## 2020-12-20 MED ORDER — SODIUM CHLORIDE 0.9% IV SOLUTION: Freq: Once | INTRAVENOUS | Status: AC

## 2020-12-20 MED ORDER — POTASSIUM CHLORIDE CRYS ER 20 MEQ PO TBCR
40.0000 meq | EXTENDED_RELEASE_TABLET | Freq: Every day | ORAL | Status: AC
  Administered 2020-12-20 – 2020-12-21 (×2): 40 meq via ORAL
  Filled 2020-12-20 (×2): qty 2

## 2020-12-20 NOTE — Progress Notes (Signed)
   ORTHOPAEDIC PROGRESS NOTE  s/p Procedure(s): OPEN REDUCTION INTERNAL FIXATION (ORIF) DISTAL FEMUR FRACTURE OPEN REDUCTION INTERNAL FIXATION (ORIF) PERIPROSTHETIC FRACTURE  SUBJECTIVE: Patient doing well this morning. Pain well controlled. Denies distal n/t. Working well with PT/OT and following WB restrictions. Hgb 6.6 this AM, but patient not SOB or tachycardic.  OBJECTIVE: PE: Dressing c/d/I Fires TA/GS Sensation intact in the foot. Palpable 2+DP pulse  Vitals:   12/19/20 2336 12/20/20 0333  BP: (!) 107/53 (!) 120/46  Pulse: 80   Resp: (!) 24 (!) 21  Temp: 98.7 F (37.1 C) 98.9 F (37.2 C)  SpO2: 98%      ASSESSMENT: Helen James is a 65 y.o. female doing well postoperatively. POD#2  PLAN: Weightbearing: TDWB RLE Insicional and dressing care: Reinforce dressings as needed mepilex dressings changed POD1, now remain clean and dry Orthopedic device(s): None DVT prophylaxis: lovenox starting POD1 x4 weeks Pain control: will switch to Norco as she has been intolerant of percocet in the past Follow up: 2 weeks after surgery for a wound check with Dr. Blanchie Dessert at [facility].   Contact information:  After hours and holidays please check [email or web address] for group call information for [facility]  Weber Cooks, MD Orthopaedic Surgery

## 2020-12-20 NOTE — TOC CAGE-AID Note (Signed)
Transition of Care [facility]) - CAGE-AID Screening   Patient Details  Name: KATIANNA MCCLENNEY MRN: [id number] Date of Birth: [birth year]  Transition of Care [facility] CM/SW Contact:    [place] C Tarpley-Carter, LCSWA Phone Number: 12/20/2020, 12:08 PM   Clinical Narrative: Pt participated in Cage-Aid.  Pt stated she does not use substance or ETOH.  Pt was not offered resources, due to no usage of substance or ETOH.     [place] Tarpley-Carter, MSW, LCSW-A Pronouns:  She/Her/Hers [facility]itions of Care Clinical Social Worker Direct Number:  [phone] [email or web address]   CAGE-AID Screening:    Have You Ever Felt You Ought to Cut Down on Your Drinking or Drug Use?: No Have People Annoyed You By [employer] Your Drinking Or Drug Use?: No Have You Felt Bad Or Guilty About Your Drinking Or Drug Use?: No Have You Ever Had a Drink or Used Drugs First Thing In The Morning to Steady Your Nerves or to Get Rid of a Hangover?: No CAGE-AID Score: 0  Substance Abuse Education Offered: No

## 2020-12-20 NOTE — Progress Notes (Addendum)
PROGRESS NOTE    Helen James  MBW:[id number] DOB: 03-Mar-1955 DOA: 12/16/2020 PCP: System, Provider Not In    Chief Complaint  Patient presents with   Leg Pain   Loss of Consciousness    Brief Narrative:  Helen James is a 65 yo w/PMHx of morbid obesity, HIV, DM2, HTN who presents w/ right distal femur fracture in the setting of vasovagal syncope.  She felt nauseous after eating her Thanksgiving meal and got up very quickly in order to make it to the bathroom in time to vomit.  Subsequently she felt dizzy then fell, lost consciousness for about 2 minutes, witnessed by her daughter.  No tongue biting, no incontinence of stool or urine.    POD #2 post right distal femur periprosthetic fracture repair on 12/19/2018 by orthopedic surgery Dr. Blanchie Dessert.  Seen by PT OT with recommendation for home health PT OT.  12/20/2020: Seen and examined at bedside.  Weak appearing.  Hemoglobin dropped this morning 6.6.  2 units PRBCs ordered to be transfused in anticipation for discharge.   Assessment & Plan:   Principal Problem:   Syncope Active Problems:   HIV disease (HCC)   H/O gastric bypass   HTN (hypertension)   HSV infection   Anemia   Syncope, vasovagal   Femoral distal fracture (HCC)  Periprosthetic right distal femoral fracture, s/p repair on 12/18/2020 by orthopedic surgery Dr. Blanchie Dessert Comminuted displaced periprosthetic distal right femoral fracture post syncope and fall. Appreciate orthopedic surgery's assistance Continue pain control Continue subcu Lovenox daily for pharmacological DVT prophylaxis  Vasovagal Syncope  Occurred in the setting of nausea - Cardiology evaluated: TTE WNL, no further workup Orthostatic vital signs when able to stand. - unlikely to be PE, if HR or O2 status changes, can consider eval We need to follow-up with cardiology, cardiology arranging for follow-up with Dr. Jens Som or a member of his team.  Acute blood loss anemia  postsurgery Hemoglobin this morning 6.6 2 units PRBC ordered to be transfused. Presented with hemoglobin of 10.8 Repeat CBC in the morning.  Improved pancytopenia in the setting of acute illness and HIV WBC has normalized. Platelet, uptrending. Repeat CBC in the morning. Follow CD4 count and viral load Will need posthospital follow-up appointment with infectious disease  Elevated D-dimer in the setting of comminuted displaced periprosthetic distal right femoral fracture  D-dimer greater than 10 Closely monitor for any acute changes. Repeat D-dimer on 12/20/2020.  Non anion gap metabolic acidosis Serum bicarb 18, anion gap 6 Start sodium bicarb 1300 mg 3 times daily x2 days. Repeat BMP in the morning  HTN (hypertension)  Maintain MAP greater than 65. Home oral antihypertensives held due to soft BPs. Continue to closely monitor vital signs.    HSV infection -stable continue home medication   HIV disease (HCC)  Continue home regimen.      DVT prophylaxis: lovenox subcu daily Code Status: full  Disposition:   Status is: Inpatient  Remains inpatient appropriate because: she needs surgery   Consultants:  Orthopedics  Procedures: n/a  Antimicrobials: n/a    Objective: Vitals:   12/20/20 1315 12/20/20 1330 12/20/20 1345 12/20/20 1649  BP: 109/65 109/65 107/66 109/60  Pulse: 78 79 79 85  Resp: 18 20 18 18   Temp: 99.2 F (37.3 C) 99.2 F (37.3 C) 98.5 F (36.9 C) 99 F (37.2 C)  TempSrc: Oral Oral Oral Oral  SpO2: 98%     Weight:      Height:  Intake/Output Summary (Last 24 hours) at 12/20/2020 1750 Last data filed at 12/20/2020 1649 Gross per 24 hour  Intake 760 ml  Output 2250 ml  Net -1490 ml   Filed Weights   12/16/20 1820  Weight: 110.7 kg    Examination:  General exam: Well-developed well-nourished in no acute distress.  She is alert and oriented x3.   Respiratory system: Clear to auscultation no wheezes  Cardiovascular system:  Regular rate and rhythm no rubs or gallops.   Gastrointestinal system: Soft nontender normal bowel sounds present.   Central nervous system: She is alert and oriented x3.   Extremities: Right knee in surgical dressing.  Able to move all 4 extremities. Skin: No rashes or ulcerative lesions noted.   Neuro: Can wiggle all her toes. Psychiatry: Mood is appropriate for condition and setting.   Data Reviewed: I have personally reviewed following labs and imaging studies  CBC: Recent Labs  Lab 12/16/20 2143 12/17/20 1439 12/19/20 0754 12/19/20 0932 12/20/20 0321  WBC 5.6 6.0 6.8 8.0 6.2  NEUTROABS 3.1  --   --   --   --   HGB 10.8* 9.9* 7.3* 7.7* 6.6*  HCT 33.0* 29.4* 21.0* 22.2* 19.3*  MCV 100.6* 99.7 96.3 97.4 97.0  PLT 191 193 140* 142* 140*    Basic Metabolic Panel: Recent Labs  Lab 12/16/20 2143 12/16/20 2330 12/17/20 0500 12/19/20 0754 12/20/20 0321  NA 140  --  139 137 141  K 3.9  --  4.1 3.9 3.9  CL 115*  --  113* 113* 113*  CO2 18*  --  18* 18* 20*  GLUCOSE 157*  --  123* 126* 102*  BUN 20  --  19 11 12   CREATININE 0.97  --  0.84 0.91 0.85  CALCIUM 8.4*  --  8.4* 7.9* 7.9*  MG  --  2.1  --   --   --   PHOS  --  3.0  --   --   --     GFR: Estimated Creatinine Clearance: 77.8 mL/min (by C-G formula based on SCr of 0.85 mg/dL).  Liver Function Tests: Recent Labs  Lab 12/16/20 2143 12/19/20 0754  AST 26 26  ALT 19 15  ALKPHOS 162* 91  BILITOT 0.2* 0.3  PROT 6.2* 5.0*  ALBUMIN 3.1* 2.5*    CBG: Recent Labs  Lab 12/19/20 2335 12/20/20 0332 12/20/20 0732 12/20/20 1205 12/20/20 1653  GLUCAP 108* 113* 130* 105* 143*     Recent Results (from the past 240 hour(s))  Resp Panel by RT-PCR (Flu A&B, Covid) Nasopharyngeal Swab     Status: None   Collection Time: 12/16/20  8:43 PM   Specimen: Nasopharyngeal Swab; Nasopharyngeal(NP) swabs in vial transport medium  Result Value Ref Range Status   SARS Coronavirus 2 by RT PCR NEGATIVE NEGATIVE Final     Comment: (NOTE) SARS-CoV-2 target nucleic acids are NOT DETECTED.  The SARS-CoV-2 RNA is generally detectable in upper respiratory specimens during the acute phase of infection. The lowest concentration of SARS-CoV-2 viral copies this assay can detect is 138 copies/mL. A negative result does not preclude SARS-Cov-2 infection and should not be used as the sole basis for treatment or other patient management decisions. A negative result may occur with  improper specimen collection/handling, submission of specimen other than nasopharyngeal swab, presence of viral mutation(s) within the areas targeted by this assay, and inadequate number of viral copies(<138 copies/mL). A negative result must be combined with clinical observations, patient history, and  epidemiological information. The expected result is Negative.  Fact Sheet for Patients:  BloggerCourse.com  Fact Sheet for Healthcare Providers:  SeriousBroker.it  This test is no t yet approved or cleared by the [place] FDA and  has been authorized for detection and/or diagnosis of SARS-CoV-2 by FDA under an Emergency Use Authorization (EUA). This EUA will remain  in effect (meaning this test can be used) for the duration of the COVID-19 declaration under Section 564(b)(1) of the Act, 21 U.S.C.section 360bbb-3(b)(1), unless the authorization is terminated  or revoked sooner.       Influenza A by PCR NEGATIVE NEGATIVE Final   Influenza B by PCR NEGATIVE NEGATIVE Final    Comment: (NOTE) The Xpert Xpress SARS-CoV-2/FLU/RSV plus assay is intended as an aid in the diagnosis of influenza from Nasopharyngeal swab specimens and should not be used as a sole basis for treatment. Nasal washings and aspirates are unacceptable for Xpert Xpress SARS-CoV-2/FLU/RSV testing.  Fact Sheet for Patients: BloggerCourse.com  Fact Sheet for Healthcare  Providers: SeriousBroker.it  This test is not yet approved or cleared by the [place] FDA and has been authorized for detection and/or diagnosis of SARS-CoV-2 by FDA under an Emergency Use Authorization (EUA). This EUA will remain in effect (meaning this test can be used) for the duration of the COVID-19 declaration under Section 564(b)(1) of the Act, 21 U.S.C. section 360bbb-3(b)(1), unless the authorization is terminated or revoked.  Performed at [facility], [address], [place], [place] [address]   Surgical pcr screen     Status: None   Collection Time: 12/18/20  3:40 AM   Specimen: Nasal Mucosa; Nasal Swab  Result Value Ref Range Status   MRSA, PCR NEGATIVE NEGATIVE Final   Staphylococcus aureus NEGATIVE NEGATIVE Final    Comment: (NOTE) The Xpert SA Assay (FDA approved for NASAL specimens in patients 70 years of age and older), is one component of a comprehensive surveillance program. It is not intended to diagnose infection nor to guide or monitor treatment. Performed at [facility], [address], [place], [place] [address]          Radiology Studies: No results found.      Scheduled Meds:  acyclovir  400 mg Oral BID   atorvastatin  10 mg Oral Daily   efavirenz-emtricitabine-tenofovir  1 tablet Oral Daily   feeding supplement  237 mL Oral BID BM   insulin aspart  0-9 Units Subcutaneous Q4H   multivitamin with minerals  1 tablet Oral Daily   potassium chloride  40 mEq Oral Daily   senna-docusate  2 tablet Oral BID   sodium bicarbonate  1,300 mg Oral QID   Continuous Infusions:  methocarbamol (ROBAXIN) IV       LOS: 4 days  I spent additional 30 minutes in management, planning, and care coordination.  Darlin Drop, MD [facility]   To contact the attending provider between 7A-7P or the covering provider during after hours 7P-7A, please log into the web site [email or web address] and  access using universal [place] password for that web site. If you do not have the password, please call the hospital operator.  12/20/2020, 5:50 PM

## 2020-12-20 NOTE — Care Management Important Message (Signed)
Important Message  Patient Details  Name: Helen James MRN: [id number] Date of Birth: 1955-10-09   Medicare Important Message Given:  Yes     Sachit Gilman Stefan Church 12/20/2020, 4:22 PM

## 2020-12-20 NOTE — Progress Notes (Signed)
Brief cardiology summary note:  Seen by Dr. Jens Som 12/17/20 for syncope and preoperative evaluation. Recommended for echo, returned as unremarkable. He suspected vagal syncope in the setting of nausea and orthostasis.  Monitor felt to be low yield unless she has recurrent episodes. Recommended discontinuing HCTZ long term given possible orthostasis as a cause for syncope.  [facility] will sign off.   Medication Recommendations:  stop losartan, HCTZ at discharge given hypotension Other recommendations (labs, testing, etc):  none Follow up as an outpatient:  We will arrange for outpatient follow up with Dr. Jens Som or a member of his team.  Jodelle Red, MD, PhD, [facility]  [facility]  [facility] [address], [address] [place], [place] [address] [phone]

## 2020-12-20 NOTE — Progress Notes (Signed)
Physical Therapy Treatment Patient Details Name: Helen James MRN: [id number] DOB: 27-Jun-1955 Today's Date: 12/20/2020   History of Present Illness This 65 y.o. female admitted with periprosthetic distal femur fracture that occured  after a syncopal event (likely vasovagal) in which pt fell.  She underwent ORIF. 11/28 Hgb 6.6 to get transfusion  PMH includes: DM, HIV, HTN, morbid obesity, s/p gastric bypass, s/p Rt TKA    PT Comments    On arrival, RN in to initiate blood transfusion. Session focused on RLE ROM exercises with pt requiring AAROM for most exercises.    Recommendations for follow up therapy are one component of a multi-disciplinary discharge planning process, led by the attending physician.  Recommendations may be updated based on patient status, additional functional criteria and insurance authorization.  Follow Up Recommendations  Home health PT     Assistance Recommended at Discharge Frequent or constant Supervision/Assistance  Equipment Recommendations  Rolling walker (2 wheels);Wheelchair (measurements PT);Wheelchair cushion (measurements PT);BSC/3in1    Recommendations for Other Services       Precautions / Restrictions Precautions Precautions: Fall Precaution Comments: syncopal episode Restrictions RLE Weight Bearing: Touchdown weight bearing     Mobility  Bed Mobility               General bed mobility comments: deferred due to RN in to start blood transfusion    Transfers                        Ambulation/Gait                   Stairs             Wheelchair Mobility    Modified Rankin (Stroke Patients Only)       Balance                                            Cognition Arousal/Alertness: Awake/alert Behavior During Therapy: WFL for tasks assessed/performed Overall Cognitive Status: Within Functional Limits for tasks assessed                                           Exercises General Exercises - Lower Extremity Ankle Circles/Pumps: AROM;Both;10 reps Quad Sets: AROM;Right;10 reps Short Arc QuadBarbaraann James;Right;10 reps Heel Slides: AAROM;Right;10 reps Hip ABduction/ADduction: AAROM;Right;10 reps Straight Leg Raises: AAROM;Right;10 reps    General Comments        Pertinent Vitals/Pain Pain Assessment: 0-10 Pain Score: 4  Pain Location: Rt knee Pain Descriptors / Indicators: Operative site guarding;Discomfort Pain Intervention(s): Limited activity within patient's tolerance;Monitored during session    Home Living                          Prior Function            PT Goals (current goals can now be found in the care plan section) Acute Rehab PT Goals Patient Stated Goal: to get stronger and go home Time For Goal Achievement: 01/01/21 Potential to Achieve Goals: Good Progress towards PT goals: Progressing toward goals    Frequency    Min 5X/week      PT Plan Current plan remains appropriate    Co-evaluation  AM-PAC PT "6 Clicks" Mobility   Outcome Measure  Help needed turning from your back to your side while in a flat bed without using bedrails?: A Little Help needed moving from lying on your back to sitting on the side of a flat bed without using bedrails?: A Little Help needed moving to and from a bed to a chair (including a wheelchair)?: A Little Help needed standing up from a chair using your arms (e.g., wheelchair or bedside chair)?: A Little Help needed to walk in hospital room?: Total Help needed climbing 3-5 steps with a railing? : Total 6 Click Score: 14    End of Session   Activity Tolerance: Patient tolerated treatment well Patient left: in bed;with call bell/phone within reach;with family/visitor present;with nursing/sitter in room   PT Visit Diagnosis: Unsteadiness on feet (R26.81);History of falling (Z91.81);Muscle weakness (generalized) (M62.81);Other abnormalities of gait  and mobility (R26.89)     Time: 1610-9604 PT Time Calculation (min) (ACUTE ONLY): 34 min  Charges:  $Therapeutic Exercise: 23-37 mins                      Jerolyn Center, PT Acute Rehabilitation Services  Pager [phone] Office [phone]    Zena Amos 12/20/2020, 3:19 PM

## 2020-12-21 DIAGNOSIS — R55 Syncope and collapse: Secondary | ICD-10-CM | POA: Diagnosis not present

## 2020-12-21 LAB — TYPE AND SCREEN
ABO/RH(D): B POS
Antibody Screen: NEGATIVE
Unit division: 0
Unit division: 0

## 2020-12-21 LAB — BPAM RBC
Blood Product Expiration Date: 202212052359
Blood Product Expiration Date: 202212142359
ISSUE DATE / TIME: 202211281002
ISSUE DATE / TIME: 202211281337
Unit Type and Rh: 7300
Unit Type and Rh: 7300

## 2020-12-21 LAB — HIV-1 RNA QUANT-NO REFLEX-BLD
HIV 1 RNA Quant: <20 copies/mL
LOG10 HIV-1 RNA: UNDETERMINED log10copy/mL

## 2020-12-21 LAB — CBC
HCT: 27.3 % — ABNORMAL LOW (ref 36.0–46.0)
Hemoglobin: 9.5 g/dL — ABNORMAL LOW (ref 12.0–15.0)
MCH: 32 pg (ref 26.0–34.0)
MCHC: 34.8 g/dL (ref 30.0–36.0)
MCV: 91.9 fL (ref 80.0–100.0)
Platelets: 161 10*3/uL (ref 150–400)
RBC: 2.97 MIL/uL — ABNORMAL LOW (ref 3.87–5.11)
RDW: 14.4 % (ref 11.5–15.5)
WBC: 6.4 10*3/uL (ref 4.0–10.5)
nRBC: 0.3 % — ABNORMAL HIGH (ref 0.0–0.2)

## 2020-12-21 LAB — GLUCOSE, CAPILLARY
Glucose-Capillary: 99 mg/dL (ref 70–99)
Glucose-Capillary: 90 mg/dL (ref 70–99)
Glucose-Capillary: 84 mg/dL (ref 70–99)

## 2020-12-21 MED ORDER — HYDROCODONE-ACETAMINOPHEN 5-325 MG PO TABS
1.0000 | ORAL_TABLET | Freq: Three times a day (TID) | ORAL | 0 refills | Status: AC | PRN
Start: 2020-12-21 — End: 2020-12-26

## 2020-12-21 MED ORDER — ENOXAPARIN SODIUM 40 MG/0.4ML IJ SOSY
PREFILLED_SYRINGE | INTRAMUSCULAR | Status: AC
  Filled 2020-12-21: qty 0.4

## 2020-12-21 MED ORDER — ENOXAPARIN SODIUM 40 MG/0.4ML IJ SOSY
40.0000 mg | PREFILLED_SYRINGE | INTRAMUSCULAR | Status: DC
  Administered 2020-12-21: 40 mg via SUBCUTANEOUS

## 2020-12-21 MED ORDER — POLYETHYLENE GLYCOL 3350 17 G PO PACK: 17.0000 g | PACK | Freq: Every day | ORAL | 0 refills | Status: AC | PRN

## 2020-12-21 MED ORDER — ENOXAPARIN SODIUM 40 MG/0.4ML IJ SOSY
40.0000 mg | PREFILLED_SYRINGE | INTRAMUSCULAR | 0 refills | Status: DC
Start: 2020-12-21 — End: 2021-11-08

## 2020-12-21 NOTE — Plan of Care (Signed)
  Problem: Activity: Goal: Ability to return to normal activity level will improve to the fullest extent possible by discharge Outcome: Progressing   Problem: Education: Goal: Knowledge of medication regimen will be met for pain relief regimen by discharge Outcome: Progressing Goal: Understanding of ways to prevent infection will improve by discharge Outcome: Progressing   Problem: Coping: Goal: Ability to verbalize feelings will improve by discharge Outcome: Progressing Goal: Family members realistic understanding of the patients condition will improve by discharge Outcome: Progressing   Problem: Fluid Volume: Goal: Maintenance of adequate hydration will improve by discharge Outcome: Progressing   Problem: Medication: Goal: Compliance with prescribed medication regimen will improve by discharge Outcome: Progressing   Problem: Physical Regulation: Goal: Hemodynamic stability will return to baseline for the patient by discharge Outcome: Progressing Goal: Diagnostic test results will improve Outcome: Progressing Goal: Will remain free from infection Outcome: Progressing   Problem: Respiratory: Goal: Ability to maintain adequate oxygenation and ventilation will improve by discharge Outcome: Progressing   Problem: Role Relationship: Goal: Ability to identify and utilize available support systems will improve by discharge Outcome: Progressing   Problem: Pain Management: Goal: Satisfaction with pain management regimen will be met by discharge Outcome: Progressing   Problem: Education: Goal: Knowledge of General Education information will improve Description: Including pain rating scale, medication(s)/side effects and non-pharmacologic comfort measures Outcome: Progressing   Problem: Health Behavior/Discharge Planning: Goal: Ability to manage health-related needs will improve Outcome: Progressing   Problem: Clinical Measurements: Goal: Ability to maintain clinical  measurements within normal limits will improve Outcome: Progressing Goal: Will remain free from infection Outcome: Progressing Goal: Diagnostic test results will improve Outcome: Progressing Goal: Respiratory complications will improve Outcome: Progressing Goal: Cardiovascular complication will be avoided Outcome: Progressing   Problem: Activity: Goal: Risk for activity intolerance will decrease Outcome: Progressing   Problem: Nutrition: Goal: Adequate nutrition will be maintained Outcome: Progressing   Problem: Coping: Goal: Level of anxiety will decrease Outcome: Progressing   Problem: Elimination: Goal: Will not experience complications related to bowel motility Outcome: Progressing Goal: Will not experience complications related to urinary retention Outcome: Progressing   Problem: Pain Managment: Goal: General experience of comfort will improve Outcome: Progressing   Problem: Safety: Goal: Ability to remain free from injury will improve Outcome: Progressing   Problem: Skin Integrity: Goal: Risk for impaired skin integrity will decrease Outcome: Progressing

## 2020-12-21 NOTE — Progress Notes (Signed)
   ORTHOPAEDIC PROGRESS NOTE  s/p Procedure(s): OPEN REDUCTION INTERNAL FIXATION (ORIF) DISTAL FEMUR FRACTURE OPEN REDUCTION INTERNAL FIXATION (ORIF) PERIPROSTHETIC FRACTURE  SUBJECTIVE: Patient doing well this morning. Pain well controlled.  She has been working well with PT.  Received 2 units of PRBCs secondary to drop in her hemoglobin to 6.6.  This responded appropriately and is 9.5 today.  She feels well and is eager to go home.  OBJECTIVE: PE: Dressings changed today incisions clean dry and sutures intact, no erythema or drainage Fires TA/GS Sensation intact in the foot. Palpable 2+DP pulse  Vitals:   12/21/20 0403 12/21/20 0731  BP: (!) 100/51 116/67  Pulse: 68 70  Resp: 19 19  Temp: 98.1 F (36.7 C) 98.5 F (36.9 C)  SpO2: 98%      ASSESSMENT: Helen James is a 65 y.o. female doing well postoperatively. POD#2  PLAN: Weightbearing: TDWB RLE Insicional and dressing care: Reinforce dressings as needed mepilex dressings changed 11/29, now remain clean and dry Orthopedic device(s): None DVT prophylaxis: lovenox starting POD1 x4 weeks Pain control: will switch to Norco as she has been intolerant of percocet in the past Follow up: 2 weeks after surgery for a wound check and suture removal with Dr. Blanchie Dessert at [facility].   Contact information:  After hours and holidays please check [email or web address] for group call information for [facility]  Weber Cooks, MD Orthopaedic Surgery

## 2020-12-21 NOTE — Discharge Summary (Signed)
Discharge Summary  Helen James:[id number] DOB: 1955/02/13  PCP: System, Provider Not In  Admit date: 12/16/2020 Discharge date: 12/21/2020  Time spent: 35 minutes.  Recommendations for Outpatient Follow-up:  Follow-up with orthopedic surgery Follow-up with your primary care provider Take your medications as prescribed Continue PT OT with assistance and fall precautions   Discharge Diagnoses:  Active Hospital Problems   Diagnosis Date Noted   Syncope 12/16/2020   Femoral distal fracture (HCC) 12/17/2020   Anemia 12/16/2020   Syncope, vasovagal 12/16/2020   HTN (hypertension) 12/27/2015   HSV infection 12/27/2015   H/O gastric bypass 12/27/2015   HIV disease (HCC) 12/03/2015    Resolved Hospital Problems  No resolved problems to display.    Discharge Condition: Stable  Diet recommendation: Resume previous diet.  Vitals:   12/21/20 0731 12/21/20 1153  BP: 116/67 125/70  Pulse: 70 80  Resp: 19 18  Temp: 98.5 F (36.9 C) 98.6 F (37 C)  SpO2:      History of present illness:  Helen James is a 65 yo w/PMHx of morbid obesity, HIV, DM2, HTN who presents w/ right distal femur fracture in the setting of vasovagal syncope.  She felt nauseous after eating her Thanksgiving meal.  States she got up very quickly in order to make it to the bathroom in time to vomit.  Subsequently she felt dizzy and collapsed.  She lost consciousness for about 2 minutes, witnessed by her daughter.  No tongue biting, no incontinence of stool or urine.   Post right distal femur periprosthetic fracture repair on 12/18/2020 by orthopedic surgery Dr. Blanchie Dessert.  Seen by PT OT with recommendation for home health PT OT.  Hospital course complicated by acute blood loss for which she was transfused 2 units PRBCs for hemoglobin of 6.6 K.  Repeated hemoglobin post transfusions 9.9 K.  12/21/2020: Patient was seen at her bedside.  There were no acute events overnight.  She has no new  complaints.   Hospital Course:  Principal Problem:   Syncope Active Problems:   HIV disease (HCC)   H/O gastric bypass   HTN (hypertension)   HSV infection   Anemia   Syncope, vasovagal   Femoral distal fracture (HCC)  Periprosthetic right distal femoral fracture from fall, s/p repair on 12/18/2020 by orthopedic surgery Dr. Blanchie Dessert Comminuted displaced periprosthetic distal right femoral fracture post syncope and fall. Subcu Lovenox daily for pharmacological DVT prophylaxis x4 weeks as recommended by orthopedic surgery. Pain control as needed Continue PT OT with assistance and fall precautions. Follow-up with Dr. Blanchie Dessert 2 weeks postop as recommended by orthopedic surgery.  Vasovagal Syncope  Occurred in the setting of nausea - Cardiology evaluated: TTE WNL, no further workup Cardiology arranging for follow-up with Dr. Jens Som or a member of his team.   Acute blood loss anemia postsurgery She was transfused 2 units PRBC for hemoglobin of 6.6 K on 12/20/2020. Presented with hemoglobin of 10.8 Repeat hemoglobin posttransfusion was 9.9 K. Follow-up with your PCP.   Resolved pancytopenia in the setting of acute illness and HIV   Elevated D-dimer in the setting of comminuted displaced periprosthetic distal right femoral fracture  D-dimer greater than 10 on presentation, down trended to 3.41 on 12/20/2020.   Improving, Non anion gap metabolic acidosis Serum bicarb 20 from 18, anion gap 6 Received sodium bicarb 1300 mg 3 times daily x2 days.   HTN (hypertension), stable Home oral antihypertensives held due to soft BPs, Tra[place] hypotension. Follow-up with Dr.   Truddie Hidden  infection/HIV disease Continue home regimen Follow-up with infectious disease      DVT prophylaxis: lovenox subcu daily  Code Status: full    Consultants:  Orthopedics   Procedures: Periprosthetic right distal femoral fracture from fall, s/p repair on 12/18/2020 by orthopedic surgery Dr.  Blanchie Dessert.   Antimicrobials: n/a  Discharge Exam: BP 125/70 (BP Location: Right Wrist)   Pulse 80   Temp 98.6 F (37 C) (Oral)   Resp 18   Ht 5\' 3"  (1.6 m)   Wt 110.7 kg   SpO2 98%   BMI 43.22 kg/m  General: 65 y.o. year-old female well developed well nourished in no acute distress.  Alert and oriented x3. Cardiovascular: Regular rate and rhythm with no rubs or gallops.  No thyromegaly or JVD noted.   Respiratory: Clear to auscultation with no wheezes or rales. Good inspiratory effort. Abdomen: Soft nontender nondistended with normal bowel sounds x4 quadrants. Musculoskeletal: Trace lower extremity edema bilaterally. Psychiatry: Mood is appropriate for condition and setting  Discharge Instructions You were cared for by a hospitalist during your hospital stay. If you have any questions about your discharge medications or the care you received while you were in the hospital after you are discharged, you can call the unit and asked to speak with the hospitalist on call if the hospitalist that took care of you is not available. Once you are discharged, your primary care physician will handle any further medical issues. Please note that NO REFILLS for any discharge medications will be authorized once you are discharged, as it is imperative that you return to your primary care physician (or establish a relationship with a primary care physician if you do not have one) for your aftercare needs so that they can reassess your need for medications and monitor your lab values.   Allergies as of 12/21/2020       Reactions   Ramipril Swelling   Facial swelling   Aspirin Tinitus   Feldene [piroxicam] Other (See Comments)   States causes eyelids to "jump"   Latex Itching   Nsaids Other (See Comments)   Ears ringing   Percocet [oxycodone-acetaminophen] Other (See Comments)   Hallucinations per patient   Tylenol [acetaminophen] Itching   Pt states itching only occurred when taken with  Oxycodone.  No reaction with tylenol alone        Medication List     STOP taking these medications    hydrochlorothiazide 12.5 MG capsule Commonly known as: MICROZIDE   losartan 100 MG tablet Commonly known as: COZAAR       TAKE these medications    acyclovir 400 MG tablet Commonly known as: ZOVIRAX TAKE 1 TABLET TWICE DAILY   atorvastatin 10 MG tablet Commonly known as: LIPITOR Take 10 mg by mouth daily.   Biotin 5000 MCG Tabs Take 5,000 mcg by mouth daily.   diphenhydrAMINE 25 MG tablet Commonly known as: BENADRYL Take 1 tablet (25 mg total) by mouth 3 (three) times daily. Take one tablet three times daily for two days What changed:  when to take this reasons to take this additional instructions   efavirenz-emtricitabine-tenofovir 600-200-300 MG tablet Commonly known as: ATRIPLA TAKE 1 TABLET BY MOUTH EVERYDAY AT BEDTIME What changed: See the new instructions.   enoxaparin 40 MG/0.4ML injection Commonly known as: LOVENOX Inject 0.4 mLs (40 mg total) into the skin daily for 25 days.   HYDROcodone-acetaminophen 5-325 MG tablet Commonly known as: NORCO/VICODIN Take 1 tablet by mouth every 8 (eight) hours as  needed for up to 5 days for severe pain.   MAGNESIUM PO Take 125 mg by mouth 2 (two) times daily.   multivitamin tablet Take 1 tablet by mouth daily.   polyethylene glycol 17 g packet Commonly known as: MiraLax Take 17 g by mouth daily as needed.   ursodiol 250 MG tablet Commonly known as: ACTIGALL Take 250 mg by mouth 2 (two) times daily.   VITAMIN B-12 PO Take 1 tablet by mouth daily.               Durable Medical Equipment  (From admission, onward)           Start     Ordered   12/21/20 0953  For home use only DME Bedside commode  Once       Comments: Patient will need heavy duty equipment due to body habitus, weight carried in lower body necessitating larger seat.  Question:  Patient needs a bedside commode to treat with  the following condition  Answer:  Ambulatory dysfunction   12/21/20 0953   12/21/20 0700  For home use only DME Walker rolling  Once       Comments: 2 wheels  Question Answer Comment  Walker: With 5 Inch Wheels   Patient needs a walker to treat with the following condition Ambulatory dysfunction      12/21/20 0557   12/21/20 0557  For home use only DME standard manual wheelchair with seat cushion  Once       Comments: Patient suffers from ambulatory dysfunction which impairs their ability to perform daily activities like walking in the home.  A cane will not resolve issue with performing activities of daily living. A wheelchair will allow patient to safely perform daily activities. Patient can safely propel the wheelchair in the home or has a caregiver who can provide assistance. Length of need 6 months. Accessories: elevating leg rests, wheel locks, extensions and anti-tippers.   12/21/20 0557           Allergies  Allergen Reactions   Ramipril Swelling    Facial swelling   Aspirin Tinitus   Feldene [Piroxicam] Other (See Comments)    States causes eyelids to "jump"   Latex Itching   Nsaids Other (See Comments)    Ears ringing   Percocet [Oxycodone-Acetaminophen] Other (See Comments)    Hallucinations per patient   Tylenol [Acetaminophen] Itching    Pt states itching only occurred when taken with Oxycodone.  No reaction with tylenol alone    Follow-up Information     Jodelle Gross, NP Follow up on 01/10/2021.   Specialties: Nurse Practitioner, Radiology, Cardiology Why: at 2:45pm for your follow up appt with Dr. Ludwig Clarks NP Sherry Ruffing information: [address] [place] [address] [phone]         Joen Laura, MD. Call today.   Specialty: Orthopedic Surgery Why: Please call for a posthospital follow-up appointment. Contact information: [address] [place] [address] [phone]         [facility] Follow up.   Specialty: Home Health Services Why: For home health services. They will call you in 1-2 days to set up your first home appointment Contact information: [address] [place] [address] [phone]                  The results of significant diagnostics from this hospitalization (including imaging, microbiology, ancillary and laboratory) are listed  below for reference.    Significant Diagnostic Studies: DG Chest 1 View  Result Date: 12/16/2020 CLINICAL DATA:  Fall Rt leg pain no chest complaints, best obtainable images due to patient size and uncontrolled pain EXAM: CHEST  1 VIEW COMPARISON:  None. FINDINGS: Slightly prominent cardiac silhouette. Otherwise the heart and mediastinal contours are within normal limits. Aortic calcification No focal consolidation. No pulmonary edema. No pleural effusion. No pneumothorax. No acute osseous abnormality. Severe degenerative changes of the right shoulder from IMPRESSION: No active disease. Electronically Signed   By: Tish Frederickson M.D.   On: 12/16/2020 20:37   CT KNEE RIGHT WO CONTRAST  Result Date: 12/17/2020 CLINICAL DATA:  Femur fracture, preop EXAM: CT OF THE right KNEE WITHOUT CONTRAST TECHNIQUE: Multidetector CT imaging of the right knee was performed according to the standard protocol. Multiplanar CT image reconstructions were also generated. COMPARISON:  Right knee x-ray 12/16/2020 FINDINGS: Bones/Joint/Cartilage Previous total right knee arthroplasty surgical changes. There is an acute comminuted periprosthetic fracture at the distal diaphysis and metaphysis of the femur with moderate displacement of fracture fragments measuring up to 1.6 cm and medial angulation of the distal fragment. No additional fracture identified. Lipohemarthrosis partially visualized. Ligaments Suboptimally assessed by CT. Muscles and Tendons Limited visualization due to metallic artifacts. Soft tissues Unremarkable.  IMPRESSION: 1. Acute comminuted periprosthetic displaced fracture of the distal diaphysis and metaphysis of the femur. 2. Lipohemarthrosis. Electronically Signed   By: Jannifer Hick M.D.   On: 12/17/2020 11:38   DG Knee Complete 4 Views Right  Result Date: 12/16/2020 CLINICAL DATA:  Syncope, right leg pain EXAM: RIGHT KNEE - COMPLETE 4+ VIEW COMPARISON:  None. FINDINGS: Frontal, bilateral oblique, and cross-table lateral views of the right knee are obtained. There is a 3 component right knee arthroplasty identified. A comminuted periprosthetic fracture is seen involving the distal right femoral metadiaphyseal junction, with moderate displacement and ventral angulation at the fracture site. There is diffuse soft tissue swelling. IMPRESSION: 1. Comminuted displaced periprosthetic distal right femoral fracture. Electronically Signed   By: Sharlet Salina M.D.   On: 12/16/2020 20:28   DG C-Arm 1-60 Min-No Report  Result Date: 12/18/2020 CLINICAL DATA:  ORIF right femur fracture EXAM: RIGHT FEMUR 2 VIEWS; DG C-ARM 1-60 MIN-NO REPORT COMPARISON:  Right femur radiographs-12/17/2020 FLUOROSCOPY TIME:  3 minutes, 23 seconds (7 mGy) FINDINGS: 7 spot intraoperative fluoroscopic images of the distal aspect of the right femur are provided for review and demonstrate the sequela of sideplate fixation of comminuted distal periprosthetic femur fracture. There is improved alignment of the fracture fragments with persistent mild displacement of the medially located fracture fragment. Stable sequela of previous right total knee replacement, incompletely evaluated. There is expected subcutaneous emphysema about the operative site. No radiopaque foreign body. IMPRESSION: Post sideplate fixation of comminuted distal periprosthetic femur fracture without evidence of complication. Electronically Signed   By: Simonne Come M.D.   On: 12/18/2020 11:06   ECHOCARDIOGRAM COMPLETE  Result Date: 12/17/2020    ECHOCARDIOGRAM REPORT    Patient Name:   Helen James Date of Exam: 12/17/2020 Medical Rec #:  [id number]        Height:       63.0 in Accession #:    [id number]       Weight:       244.0 lb Date of Birth:  20-Apr-1955       BSA:          2.104 m Patient Age:    74  years         BP:           117/76 mmHg Patient Gender: F                HR:           71 bpm. Exam Location:  Inpatient Procedure: 2D Echo, Cardiac Doppler, Color Doppler and Intracardiac            Opacification Agent Indications:    Syncope R55  History:        Patient has no prior history of Echocardiogram examinations.                 Risk Factors:Hypertension, Diabetes and Dyslipidemia. Preop                 cardiovascular evaluation.  Sonographer:    Leta Jungling RDCS Referring Phys: [id number] ANASTASSIA DOUTOVA IMPRESSIONS  1. Left ventricular ejection fraction, by estimation, is 55 to 60%. The left ventricle has normal function. The left ventricle has no regional wall motion abnormalities. Left ventricular diastolic parameters were normal.  2. Right ventricular systolic function is normal. The right ventricular size is normal. There is normal pulmonary artery systolic pressure.  3. Cannot r/o PFO.  4. The pericardial effusion is posterior to the left ventricle.  5. The mitral valve is normal in structure. No evidence of mitral valve regurgitation. No evidence of mitral stenosis.  6. The aortic valve is tricuspid. There is mild calcification of the aortic valve. Aortic valve regurgitation is trivial. Aortic valve sclerosis is present, with no evidence of aortic valve stenosis.  7. The inferior vena cava is normal in size with greater than 50% respiratory variability, suggesting right atrial pressure of 3 mmHg. FINDINGS  Left Ventricle: Left ventricular ejection fraction, by estimation, is 55 to 60%. The left ventricle has normal function. The left ventricle has no regional wall motion abnormalities. Definity contrast agent was given IV to delineate the left ventricular   endocardial borders. The left ventricular internal cavity size was normal in size. There is no left ventricular hypertrophy. Left ventricular diastolic parameters were normal. Right Ventricle: The right ventricular size is normal. No increase in right ventricular wall thickness. Right ventricular systolic function is normal. There is normal pulmonary artery systolic pressure. The tricuspid regurgitant velocity is 2.54 m/s, and  with an assumed right atrial pressure of 3 mmHg, the estimated right ventricular systolic pressure is 28.8 mmHg. Left Atrium: Left atrial size was normal in size. Right Atrium: Right atrial size was normal in size. Pericardium: Trivial pericardial effusion is present. The pericardial effusion is posterior to the left ventricle. Mitral Valve: The mitral valve is normal in structure. No evidence of mitral valve regurgitation. No evidence of mitral valve stenosis. Tricuspid Valve: The tricuspid valve is normal in structure. Tricuspid valve regurgitation is mild . No evidence of tricuspid stenosis. Aortic Valve: The aortic valve is tricuspid. There is mild calcification of the aortic valve. Aortic valve regurgitation is trivial. Aortic valve sclerosis is present, with no evidence of aortic valve stenosis. Pulmonic Valve: The pulmonic valve was normal in structure. Pulmonic valve regurgitation is not visualized. No evidence of pulmonic stenosis. Aorta: The aortic root is normal in size and structure. Venous: The inferior vena cava is normal in size with greater than 50% respiratory variability, suggesting right atrial pressure of 3 mmHg. IAS/Shunts: The interatrial septum was not well visualized.  LEFT VENTRICLE PLAX 2D LVIDd:  4.00 cm     Diastology LVIDs:         2.30 cm     LV e' medial:    7.83 cm/s LV PW:         0.80 cm     LV E/e' medial:  11.8 LV IVS:        0.80 cm     LV e' lateral:   9.36 cm/s LVOT diam:     1.90 cm     LV E/e' lateral: 9.9 LV SV:         77 LV SV Index:   36  LVOT Area:     2.84 cm  LV Volumes (MOD) LV vol d, MOD A2C: 89.5 ml LV vol d, MOD A4C: 94.7 ml LV vol s, MOD A2C: 20.8 ml LV vol s, MOD A4C: 28.4 ml LV SV MOD A2C:     68.7 ml LV SV MOD A4C:     94.7 ml LV SV MOD BP:      68.3 ml LEFT ATRIUM             Index LA diam:        2.60 cm 1.24 cm/m LA Vol (A2C):   35.1 ml 16.68 ml/m LA Vol (A4C):   33.8 ml 16.04 ml/m LA Biplane Vol: 31.1 ml 14.78 ml/m  AORTIC VALVE             PULMONIC VALVE LVOT Vmax:   130.00 cm/s PR End Diast Vel: 4.58 msec LVOT Vmean:  90.800 cm/s LVOT VTI:    0.270 m  AORTA Ao Root diam: 2.70 cm Ao Asc diam:  3.00 cm MITRAL VALVE               TRICUSPID VALVE MV Area (PHT): 3.99 cm    TR Peak grad:   25.8 mmHg MV Decel Time: 190 msec    TR Vmax:        254.00 cm/s MV E velocity: 92.40 cm/s MV A velocity: 86.90 cm/s  SHUNTS MV E/A ratio:  1.06        Systemic VTI:  0.27 m                            Systemic Diam: 1.90 cm Charlton Haws MD Electronically signed by Charlton Haws MD Signature Date/Time: 12/17/2020/10:08:46 AM    Final    DG Hip Unilat W or Wo Pelvis 2-3 Views Right  Result Date: 12/16/2020 CLINICAL DATA:  Syncope, right hip pain EXAM: DG HIP (WITH OR WITHOUT PELVIS) 2-3V RIGHT COMPARISON:  None. FINDINGS: Frontal view of the pelvis as well as frontal and cross-table lateral views of the right hip are obtained. Evaluation is limited by technique and body habitus. No acute fracture, subluxation, or dislocation. Joint spaces are well preserved. Sacroiliac joints are normal. IMPRESSION: 1. Unremarkable pelvis and right hip. Electronically Signed   By: Sharlet Salina M.D.   On: 12/16/2020 20:27   DG FEMUR, MIN 2 VIEWS RIGHT  Result Date: 12/18/2020 CLINICAL DATA:  Postop, right femur ORIF. EXAM: RIGHT FEMUR 2 VIEWS COMPARISON:  12/17/2020. FINDINGS: Submitted images show placement of a long lateral fixation plate extending from the proximal femoral shaft to the distal femur, reducing the primary fracture components, with  significantly improved alignment, approximately 5-6 mm of residual medial displacement. The orthopedic hardware is well seated. Knee prosthetic components are normally aligned. IMPRESSION: Successful reduction of the comminuted distal right femur  fracture following ORIF. Electronically Signed   By: Amie Portland M.D.   On: 12/18/2020 11:43   DG FEMUR, MIN 2 VIEWS RIGHT  Result Date: 12/18/2020 CLINICAL DATA:  ORIF right femur fracture EXAM: RIGHT FEMUR 2 VIEWS; DG C-ARM 1-60 MIN-NO REPORT COMPARISON:  Right femur radiographs-12/17/2020 FLUOROSCOPY TIME:  3 minutes, 23 seconds (7 mGy) FINDINGS: 7 spot intraoperative fluoroscopic images of the distal aspect of the right femur are provided for review and demonstrate the sequela of sideplate fixation of comminuted distal periprosthetic femur fracture. There is improved alignment of the fracture fragments with persistent mild displacement of the medially located fracture fragment. Stable sequela of previous right total knee replacement, incompletely evaluated. There is expected subcutaneous emphysema about the operative site. No radiopaque foreign body. IMPRESSION: Post sideplate fixation of comminuted distal periprosthetic femur fracture without evidence of complication. Electronically Signed   By: Simonne Come M.D.   On: 12/18/2020 11:06   DG FEMUR, MIN 2 VIEWS RIGHT  Result Date: 12/17/2020 CLINICAL DATA:  Fall, right leg pain EXAM: RIGHT FEMUR 2 VIEWS COMPARISON:  12/16/2020 FINDINGS: Unchanged, comminuted, angulated and displaced periprosthetic fracture of the distal right femur status post right knee total arthroplasty. The proximal right femur is intact. Examination is generally somewhat limited by large body habitus. Soft tissues are unremarkable. IMPRESSION: Unchanged, comminuted, angulated and displaced periprosthetic fracture of the distal right femur status post right knee total arthroplasty. The proximal right femur is intact. Electronically Signed    By: Jearld Lesch M.D.   On: 12/17/2020 11:21   VAS US CAROTID  Result Date: 12/17/2020 Carotid Arterial Duplex Study Patient Name:  Helen James  Date of Exam:   12/17/2020 Medical Rec #: [id number]         Accession #:    [id number] Date of Birth: March 01, 1955        Patient Gender: F Patient Age:   1 years Exam Location:  [facility] Procedure:      VAS US CAROTID Referring Phys: Jonny Ruiz DOUTOVA --------------------------------------------------------------------------------  Indications:       Syncope. Risk Factors:      Hypertension, Diabetes, no history of smoking. Comparison Study:  No previous exams Performing Technologist: Jody Hill RVT, RDMS  Examination Guidelines: A complete evaluation includes B-mode imaging, spectral Doppler, color Doppler, and power Doppler as needed of all accessible portions of each vessel. Bilateral testing is considered an integral part of a complete examination. Limited examinations for reoccurring indications may be performed as noted.  Right Carotid Findings: +----------+--------+-------+--------+----------------------+------------------+           PSV cm/sEDV    StenosisPlaque Description    Comments                             cm/s                                                    +----------+--------+-------+--------+----------------------+------------------+ CCA Prox  105     11             smooth and            intimal thickening  heterogenous                             +----------+--------+-------+--------+----------------------+------------------+ CCA Distal84      14                                   intimal thickening +----------+--------+-------+--------+----------------------+------------------+ ICA Prox  67      15     1-39%   calcific                                 +----------+--------+-------+--------+----------------------+------------------+ ICA Distal60      14                                                       +----------+--------+-------+--------+----------------------+------------------+ ECA       61      8                                                       +----------+--------+-------+--------+----------------------+------------------+ +----------+--------+-------+----------------+-------------------+           PSV cm/sEDV cmsDescribe        Arm Pressure (mmHG) +----------+--------+-------+----------------+-------------------+ [id number]             Multiphasic, WNL                    +----------+--------+-------+----------------+-------------------+ +---------+--------+--+--------+--+---------+ VertebralPSV cm/s51EDV cm/s13Antegrade +---------+--------+--+--------+--+---------+  Left Carotid Findings: +----------+--------+--------+--------+------------------+---------+           PSV cm/sEDV cm/sStenosisPlaque DescriptionComments  +----------+--------+--------+--------+------------------+---------+ CCA Prox  105     17                                          +----------+--------+--------+--------+------------------+---------+ CCA Distal82      13              heterogenous                +----------+--------+--------+--------+------------------+---------+ ICA Prox  73      17                                Shadowing +----------+--------+--------+--------+------------------+---------+ ICA Distal94      20                                          +----------+--------+--------+--------+------------------+---------+ ECA       78      0                                           +----------+--------+--------+--------+------------------+---------+ +----------+--------+--------+----------------+-------------------+           PSV cm/sEDV cm/sDescribe  Arm Pressure (mmHG) +----------+--------+--------+----------------+-------------------+ Subclavian160             Multiphasic, WNL                     +----------+--------+--------+----------------+-------------------+ +---------+--------+--+--------+--+---------+ VertebralPSV cm/s50EDV cm/s11Antegrade +---------+--------+--+--------+--+---------+   Summary: Right Carotid: Velocities in the right ICA are consistent with a 1-39% stenosis.                The CCA and ECA were near-normal with only minimal wall                thickening or plaque. Left Carotid: The extracranial vessels were near-normal with only minimal wall               thickening or plaque. Vertebrals:  Bilateral vertebral arteries demonstrate antegrade flow. Subclavians: Normal flow hemodynamics were seen in bilateral subclavian              arteries. *See table(s) above for measurements and observations.  Electronically signed by Gerarda Fraction on 12/17/2020 at 10:36:23 AM.    Final     Microbiology: Recent Results (from the past 240 hour(s))  Resp Panel by RT-PCR (Flu A&B, Covid) Nasopharyngeal Swab     Status: None   Collection Time: 12/16/20  8:43 PM   Specimen: Nasopharyngeal Swab; Nasopharyngeal(NP) swabs in vial transport medium  Result Value Ref Range Status   SARS Coronavirus 2 by RT PCR NEGATIVE NEGATIVE Final    Comment: (NOTE) SARS-CoV-2 target nucleic acids are NOT DETECTED.  The SARS-CoV-2 RNA is generally detectable in upper respiratory specimens during the acute phase of infection. The lowest concentration of SARS-CoV-2 viral copies this assay can detect is 138 copies/mL. A negative result does not preclude SARS-Cov-2 infection and should not be used as the sole basis for treatment or other patient management decisions. A negative result may occur with  improper specimen collection/handling, submission of specimen other than nasopharyngeal swab, presence of viral mutation(s) within the areas targeted by this assay, and inadequate number of viral copies(<138 copies/mL). A negative result must be combined with clinical observations, patient  history, and epidemiological information. The expected result is Negative.  Fact Sheet for Patients:  BloggerCourse.com  Fact Sheet for Healthcare Providers:  SeriousBroker.it  This test is no t yet approved or cleared by the [place] FDA and  has been authorized for detection and/or diagnosis of SARS-CoV-2 by FDA under an Emergency Use Authorization (EUA). This EUA will remain  in effect (meaning this test can be used) for the duration of the COVID-19 declaration under Section 564(b)(1) of the Act, 21 U.S.C.section 360bbb-3(b)(1), unless the authorization is terminated  or revoked sooner.       Influenza A by PCR NEGATIVE NEGATIVE Final   Influenza B by PCR NEGATIVE NEGATIVE Final    Comment: (NOTE) The Xpert Xpress SARS-CoV-2/FLU/RSV plus assay is intended as an aid in the diagnosis of influenza from Nasopharyngeal swab specimens and should not be used as a sole basis for treatment. Nasal washings and aspirates are unacceptable for Xpert Xpress SARS-CoV-2/FLU/RSV testing.  Fact Sheet for Patients: BloggerCourse.com  Fact Sheet for Healthcare Providers: SeriousBroker.it  This test is not yet approved or cleared by the [place] FDA and has been authorized for detection and/or diagnosis of SARS-CoV-2 by FDA under an Emergency Use Authorization (EUA). This EUA will remain in effect (meaning this test can be used) for the duration of the COVID-19 declaration under Section 564(b)(1) of the Act, 21 U.S.C. section  360bbb-3(b)(1), unless the authorization is terminated or revoked.  Performed at [facility], [address] [place], [place] [address]   Surgical pcr screen     Status: None   Collection Time: 12/18/20  3:40 AM   Specimen: Nasal Mucosa; Nasal Swab  Result Value Ref Range Status   MRSA, PCR NEGATIVE NEGATIVE Final   Staphylococcus  aureus NEGATIVE NEGATIVE Final    Comment: (NOTE) The Xpert SA Assay (FDA approved for NASAL specimens in patients 58 years of age and older), is one component of a comprehensive surveillance program. It is not intended to diagnose infection nor to guide or monitor treatment. Performed at [facility], [address] [place], [place] [address]      Labs: Basic Metabolic Panel: Recent Labs  Lab 12/16/20 2143 12/16/20 2330 12/17/20 0500 12/19/20 0754 12/20/20 0321  NA 140  --  139 137 141  K 3.9  --  4.1 3.9 3.9  CL 115*  --  113* 113* 113*  CO2 18*  --  18* 18* 20*  GLUCOSE 157*  --  123* 126* 102*  BUN 20  --  19 11 12   CREATININE 0.97  --  0.84 0.91 0.85  CALCIUM 8.4*  --  8.4* 7.9* 7.9*  MG  --  2.1  --   --   --   PHOS  --  3.0  --   --   --    Liver Function Tests: Recent Labs  Lab 12/16/20 2143 12/19/20 0754  AST 26 26  ALT 19 15  ALKPHOS 162* 91  BILITOT 0.2* 0.3  PROT 6.2* 5.0*  ALBUMIN 3.1* 2.5*   Recent Labs  Lab 12/16/20 2330  LIPASE 33   No results for input(s): AMMONIA in the last 168 hours. CBC: Recent Labs  Lab 12/16/20 2143 12/17/20 1439 12/19/20 0754 12/19/20 0932 12/20/20 0321 12/20/20 1901 12/21/20 0608  WBC 5.6   < > 6.8 8.0 6.2 8.8 6.4  NEUTROABS 3.1  --   --   --   --   --   --   HGB 10.8*   < > 7.3* 7.7* 6.6* 9.9* 9.5*  HCT 33.0*   < > 21.0* 22.2* 19.3* 29.9* 27.3*  MCV 100.6*   < > 96.3 97.4 97.0 95.8 91.9  PLT 191   < > 140* 142* 140* 135* 161   < > = values in this interval not displayed.   Cardiac Enzymes: Recent Labs  Lab 12/16/20 2330  CKTOTAL 118   BNP: BNP (last 3 results) No results for input(s): BNP in the last 8760 hours.  ProBNP (last 3 results) No results for input(s): PROBNP in the last 8760 hours.  CBG: Recent Labs  Lab 12/20/20 1949 12/20/20 2326 12/21/20 0402 12/21/20 0735 12/21/20 1158  GLUCAP 123* 122* 99 90 84       Signed:  Darlin Drop, MD [facility] 12/21/2020, 4:45 PM

## 2020-12-21 NOTE — TOC Transition Note (Signed)
Transition of Care [facility]) - CM/SW Discharge Note   Patient Details  Name: Helen James MRN: [id number] Date of Birth: August 31, 1955  Transition of Care [facility]) CM/SW Contact:  Lawerance Sabal, RN Phone Number: 12/21/2020, 9:39 AM   Clinical Narrative:    Spoke w patient and family at bedside. Agreeable to [facility] services as rec by PT OT. No preference for provider. Patient states she will be staying with her daughter [name] after DC at [address] [place] [address] and will be reachable on her cell phone.  Referral accepted by [facility]. Discussed DME needs. Patient specified needing a bari 3/1, made aware that it will be larger, she confirms that it will fit in shower.  Bari 3/1, standard WC, and RW will be delivered to room this morning. Patient has family to provide transportation home.     Final next level of care: Home w Home Health Services Barriers to Discharge: No Barriers Identified   Patient Goals and CMS Choice Patient states their goals for this hospitalization and ongoing recovery are:: to go home CMS Medicare.gov Compare Post Acute Care list provided to:: Patient Choice offered to / list presented to : Patient  Discharge Placement                       Discharge Plan and Services                DME Arranged: 3-N-1, Walker rolling, Wheelchair manual DME Agency: [employer] Date DME Agency Contacted: 12/21/20 Time DME Agency Contacted: [phone] Representative spoke with at DME Agency: Leavy Cella HH Arranged: PT, OT HH Agency: [facility] Date [facility] Agency Contacted: 12/21/20 Time HH Agency Contacted: 0930 Representative spoke with at [facility] Agency: Kandee Keen  Social Determinants of Health (SDOH) Interventions     Readmission Risk Interventions No flowsheet data found.

## 2020-12-21 NOTE — Discharge Instructions (Signed)
Diet: As you were doing prior to hospitalization   Shower:  May shower but keep the wounds dry, use an occlusive plastic wrap, NO SOAKING IN TUB.  If the bandage gets wet, change with a clean dry gauze.   Dressing:  You may change your dressing 3-5 days after surgery, unless you have a splint.  If the dressing remains clean and dry it can also be left on until follow up. If you change the dressing replace with clean gauze and tape or ace wrap.    Activity:  Increase activity slowly as tolerated, but follow the weight bearing instructions below.  The rules on driving is that you can not be taking narcotics while you drive, and you must feel in control of the vehicle.    Weight Bearing:   Touch down weight bearing right leg.    Blood clot prevention (DVT Prophylaxis): After surgery you are at an increased risk for a blood clot. you were prescribed a blood thinner, lovenox 40mg , to be taken once daily for a total of 4 weeks from surgery to help reduce your risk of getting a blood clot. This will help prevent a blood clot. Signs of a pulmonary embolus (blood clot in the lungs) include sudden short of breath, feeling lightheaded or dizzy, chest pain with a deep breath, rapid pulse rapid breathing. Signs of a blood clot in your arms or legs include new unexplained swelling and cramping, warm, red or darkened skin around the painful area. Please call the office or 911 right away if these signs or symptoms develop. To prevent constipation: you may use a stool softener such as -  Colace (over the counter) 100 mg by mouth twice a day  Drink plenty of fluids (prune juice may be helpful) and high fiber foods Miralax (over the counter) for constipation as needed.    Itching:  If you experience itching with your medications, try taking only a single pain pill, or even half a pain pill at a time.  You may take up to 10 pain pills per day, and you can also use benadryl over the counter for itching or also to help  with sleep.   Precautions:  If you experience chest pain or shortness of breath - call 911 immediately for transfer to the hospital emergency department!!   Call office [phone]) for the following: Temperature greater than 101F Persistent nausea and vomiting Severe uncontrolled pain Redness, tenderness, or signs of infection (pain, swelling, redness, odor or green/yellow discharge around the site) Difficulty breathing, headache or visual disturbances Hives Persistent dizziness or light-headedness Extreme fatigue Any other questions or concerns you may have after discharge  In an emergency, call 911 or go to an Emergency Department at a nearby hospital                                                Follow- Up Appointment:  Please call for an appointment to be seen in 2 weeks [facility] with your surgeon [facility] - [phone]

## 2020-12-21 NOTE — Progress Notes (Signed)
DME delivered to pt's room. AVS given and explained. Extra dressing supplies given, pt. Awaiting for ride.

## 2021-01-05 ENCOUNTER — Telehealth: Payer: Self-pay | Admitting: Adult Health

## 2021-01-05 NOTE — Telephone Encounter (Signed)
Spoke to the patient. She was calling to see if her appointment could be virtual with Joni Reining, DNP on 12/19. This will be her first in office appointment post hospital visit.   The patient stated that she has a broken femur and is unable to put weight on it therefore she cannot come in for an appointment.   She is able to check vitals at home.

## 2021-01-05 NOTE — Telephone Encounter (Signed)
[place]:      Patient wants to know if her appointment on 01-10-21 can be a Virtual Visit with Joni Reining?

## 2021-01-07 NOTE — Telephone Encounter (Signed)
Appointment changed. Patient aware

## 2021-01-09 NOTE — Progress Notes (Signed)
Virtual Visit via Telephone Note   This visit type was conducted due to national recommendations for restrictions regarding the COVID-19 Pandemic (e.g. social distancing) in an effort to limit this patient's exposure and mitigate transmission in our community.  Due to her co-morbid illnesses, this patient is at least at moderate risk for complications without adequate follow up.  This format is felt to be most appropriate for this patient at this time.  The patient did not have access to video technology/had technical difficulties with video requiring transitioning to audio format only (telephone).  All issues noted in this document were discussed and addressed.  No physical exam could be performed with this format.  Please refer to the patient's chart for her  consent to telehealth for [facility].   Date:  01/09/2021   ID:  Helen James, DOB 11-24-1955, MRN [id number]  Patient Location: Home Provider Location: Office/Clinic  PCP:  System, Provider Not In  Cardiologist:  Dr. Jens Som Electrophysiologist:  None   Evaluation Performed:  Follow-Up Visit  Chief Complaint: Follow-up-syncopal episode  History of Present Illness:    Helen James is a 65 y.o. female with with known history of type 2 diabetes, hypertension, hyperlipidemia, HIV, morbid obesity, history of gastric bypass in 2006 with greater than 100 pound weight loss, history of right TKR and uses a Rollator for ambulation.    Was last seen during consult request on 12/17/2020 in the setting of syncope.  This occurred after eating a large meal on Thanksgiving.  After getting up from a couch where she had been seated after dinner she was walking toward the bathroom and began to feel lightheaded and had a syncopal episode.  EMS was called, and she awoke after they had arrived.  It was noted that she fractured her right knee and the distal right femoral metadiaphyseal junction.  Dr. Jens Som evaluated her in the ED and  found that she did not have any ischemic symptoms.  She was not found to be volume overloaded she was ruled out for ACS.  An echocardiogram was completed on 12/17/2020 revealing a normal EF of 55 to 60%, normal right ventricular function, could not rule out PFO, there was a pericardial effusion posterior to the left ventricle.  The mitral valve and aortic valve were normal.  The inferior vena cava was normal in size with a right atrial pressure of 3 mmHg.  After reviewing echocardiogram it was felt that the patient could undergo right knee surgery as result of syncopal episode causing a fracture.  She is currently at home undergoing physical therapy and Occupational Therapy since discharge on 12/21/2020.  It was noted that she did have some postoperative acute blood loss and was transfused 2 units of packed red blood cells and had a hemoglobin of 9.9 on discharge (improved from 6.6).  She requested a virtual visit due to ongoing rehabilitation for her knee.  The patient does not have symptoms concerning for COVID-19 infection (fever, chills, cough, or new shortness of breath).   On speaking with her today she is doing well.  She continues to work with physical rehab and OT and her recovery from knee surgery.  She has not had any more syncopal episodes, denies any palpitations, elevated heart rate, or dyspnea.  Labs are going to be drawn again in 2 weeks by PCP.  She continues to follow with orthopedic surgeon.  She states that her blood pressures been well controlled when it is checked by physical therapy.  She is managing her blood sugar.   Past Medical History:  Diagnosis Date   DM2 (diabetes mellitus, type 2) (HCC)    HIV disease (HCC) 12/03/2015   HTN (hypertension)    Mixed hyperlipidemia    Morbid obesity with BMI of 40.0-44.9, adult [facility])    Past Surgical History:  Procedure Laterality Date   GASTRIC BYPASS OPEN  2006   HAMMER TOE SURGERY     ORIF FEMUR FRACTURE Right 12/18/2020    Procedure: OPEN REDUCTION INTERNAL FIXATION (ORIF) DISTAL FEMUR FRACTURE;  Surgeon: Joen Laura, MD;  Location: [facility] OR;  Service: Orthopedics;  Laterality: Right;   ORIF PERIPROSTHETIC FRACTURE Right 12/18/2020   Procedure: OPEN REDUCTION INTERNAL FIXATION (ORIF) PERIPROSTHETIC FRACTURE;  Surgeon: Joen Laura, MD;  Location: [facility] OR;  Service: Orthopedics;  Laterality: Right;   TOTAL KNEE ARTHROPLASTY Right 2009     No outpatient medications have been marked as taking for the 01/10/21 encounter (Appointment) with Jodelle Gross, NP.     Allergies:   Ramipril, Aspirin, Feldene [piroxicam], Latex, Nsaids, Percocet [oxycodone-acetaminophen], and Tylenol [acetaminophen]   Social History   Tobacco Use   Smoking status: Never   Smokeless tobacco: Never  Substance Use Topics   Alcohol use: No   Drug use: No     Family Hx: The patient's family history includes CAD in her mother; Diabetes in an other family member.  ROS:   Please see the history of present illness.    All other systems reviewed and are negative.   Prior CV studies:   The following studies were reviewed today:  Echocardiogram 12/17/2020 1. Left ventricular ejection fraction, by estimation, is 55 to 60%. The  left ventricle has normal function. The left ventricle has no regional  wall motion abnormalities. Left ventricular diastolic parameters were  normal.   2. Right ventricular systolic function is normal. The right ventricular  size is normal. There is normal pulmonary artery systolic pressure.   3. Cannot r/o PFO.   4. The pericardial effusion is posterior to the left ventricle.   5. The mitral valve is normal in structure. No evidence of mitral valve  regurgitation. No evidence of mitral stenosis.   6. The aortic valve is tricuspid. There is mild calcification of the  aortic valve. Aortic valve regurgitation is trivial. Aortic valve  sclerosis is present, with no evidence of aortic valve  stenosis.   7. The inferior vena cava is normal in size with greater than 50%  respiratory variability, suggesting right atrial pressure of 3 mmHg.   Carotid Doppler Ultrasound 12/17/2020 Summary:  Right Carotid: Velocities in the right ICA are consistent with a 1-39%  stenosis.                 The CCA and ECA were near-normal with only minimal wall                 thickening or plaque.   Left Carotid: The extracranial vessels were near-normal with only minimal  wall                thickening or plaque.   Vertebrals:  Bilateral vertebral arteries demonstrate antegrade flow.  Subclavians: Normal flow hemodynamics were seen in bilateral subclavian               arteries.    Labs/Other Tests and Data Reviewed:    EKG:  No ECG reviewed.  Recent Labs: 12/16/2020: Magnesium 2.1 12/19/2020: ALT 15 12/20/2020: BUN 12; Creatinine,  Ser 0.85; Potassium 3.9; Sodium 141 12/21/2020: Hemoglobin 9.5; Platelets 161   Recent Lipid Panel Lab Results  Component Value Date/Time   CHOL 175 09/19/2016 11:55 AM   TRIG 61 09/19/2016 11:55 AM   HDL 85 09/19/2016 11:55 AM   CHOLHDL 2.1 09/19/2016 11:55 AM   LDLCALC 78 09/19/2016 11:55 AM    Wt Readings from Last 3 Encounters:  12/16/20 244 lb (110.7 kg)  11/01/20 245 lb (111.1 kg)  11/11/19 237 lb (107.5 kg)     Objective:    Vital Signs:  There were no vitals taken for this visit.   VITAL SIGNS:  reviewed GEN:  no acute distress RESPIRATORY:  normal respiratory effort, symmetric expansion NEURO:  alert and oriented x 3, no obvious focal deficit PSYCH:  normal affect  ASSESSMENT & PLAN:    Syncopal episode: She was noted to be anemic during hospitalization postoperatively.  It was felt that her syncopal episode was vasovagal after a full meal and bending over.  However anemia may have contributed.  She has had no further episodes of this.  She will follow-up with cardiology in 1 year for check-in and then possibly moved to as needed.   Review of her echocardiogram revealed normal EF.  But does have cardiovascular risk factors.  2.  Status post right knee fracture:, Being followed by orthopedic surgery and undergoing physical therapy and Occupational Therapy.  She is to follow-up with them in a couple of weeks.  Defer to them for management.  3.  Hypertension: Patient reports good control of blood pressure.  This is managed by PCP.  No changes in her current medication regimen.  4.Hypercholesterolemia: Continues on statin therapy.  Follow-up labs per PCP.  COVID-19 Education: The signs and symptoms of COVID-19 were discussed with the patient and how to seek care for testing (follow up with PCP or arrange E-visit).  The importance of social distancing was discussed today.  Time:   Today, I have spent 15 minutes with the patient with telehealth technology discussing the above problems.     Medication Adjustments/Labs and Tests Ordered: Current medicines are reviewed at length with the patient today.  Concerns regarding medicines are outlined above.   Tests Ordered: No orders of the defined types were placed in this encounter.   Medication Changes: No orders of the defined types were placed in this encounter.   Disposition:  Follow up in 1 year(s)  Signed, Bettey Mare. Liborio Nixon, ANP, AACC  01/09/2021 1:30 PM    [facility]

## 2021-01-10 ENCOUNTER — Telehealth (INDEPENDENT_AMBULATORY_CARE_PROVIDER_SITE_OTHER): Payer: Medicare HMO | Admitting: Adult Health

## 2021-01-10 ENCOUNTER — Other Ambulatory Visit: Payer: Self-pay

## 2021-01-10 ENCOUNTER — Encounter: Payer: Self-pay | Admitting: Adult Health

## 2021-01-10 VITALS — BP 124/80 | HR 70 | Ht 64.0 in | Wt 240.0 lb

## 2021-01-10 DIAGNOSIS — R55 Syncope and collapse: Secondary | ICD-10-CM | POA: Diagnosis not present

## 2021-01-10 DIAGNOSIS — E7800 Pure hypercholesterolemia, unspecified: Secondary | ICD-10-CM | POA: Diagnosis not present

## 2021-01-10 DIAGNOSIS — I10 Essential (primary) hypertension: Secondary | ICD-10-CM | POA: Diagnosis not present

## 2021-01-10 DIAGNOSIS — S72401D Unspecified fracture of lower end of right femur, subsequent encounter for closed fracture with routine healing: Secondary | ICD-10-CM

## 2021-01-10 DIAGNOSIS — S72401A Unspecified fracture of lower end of right femur, initial encounter for closed fracture: Secondary | ICD-10-CM

## 2021-01-10 NOTE — Patient Instructions (Signed)
Medication Instructions:  No Changes *If you need a refill on your cardiac medications before your next appointment, please call your pharmacy*   Lab Work: No Labs If you have labs (blood work) drawn today and your tests are completely normal, you will receive your results only by: . MyChart Message (if you have MyChart) OR . A paper copy in the mail If you have any lab test that is abnormal or we need to change your treatment, we will call you to review the results.   Testing/Procedures: No Testing   Follow-Up: At [facility], you and your health needs are our priority.  As part of our continuing mission to provide you with exceptional heart care, we have created designated Provider Care Teams.  These Care Teams include your primary Cardiologist (physician) and Advanced Practice Providers (APPs -  Physician Assistants and Nurse Practitioners) who all work together to provide you with the care you need, when you need it.  We recommend signing up for the patient portal called "MyChart".  Sign up information is provided on this After Visit Summary.  MyChart is used to connect with patients for Virtual Visits (Telemedicine).  Patients are able to view lab/test results, encounter notes, upcoming appointments, etc.  Non-urgent messages can be sent to your provider as well.   To learn more about what you can do with MyChart, go to [email or web address].    Your next appointment:   1 year(s)  The format for your next appointment:   In Person  Provider:   Brian Crenshaw, MD   

## 2021-02-17 ENCOUNTER — Ambulatory Visit: Payer: Medicare HMO

## 2021-02-17 ENCOUNTER — Other Ambulatory Visit: Payer: Self-pay

## 2021-02-18 ENCOUNTER — Ambulatory Visit: Payer: Medicare HMO

## 2021-02-21 ENCOUNTER — Encounter: Payer: Self-pay | Admitting: Internal Medicine

## 2021-02-25 ENCOUNTER — Other Ambulatory Visit (HOSPITAL_COMMUNITY): Payer: Self-pay

## 2021-02-25 ENCOUNTER — Telehealth: Payer: Self-pay

## 2021-02-25 NOTE — Telephone Encounter (Signed)
[facility] Patient Advocate Encounter   I was successful in securing patient a $ 7500.00 grant from [employer] to provide copayment coverage for Atripla.  The patient's out of pocket cost will be $5.00 monthly.     I have spoken with the patient.         Dates of Eligibility: 02/25/21 through 01/22/22  Patient knows to call the office with questions or concerns.  Clearance Coots, CPhT Specialty Pharmacy Patient [facility] Phone: [phone] Fax:  [phone]

## 2021-06-07 ENCOUNTER — Other Ambulatory Visit: Payer: Self-pay | Admitting: Internal Medicine

## 2021-06-07 DIAGNOSIS — B009 Herpesviral infection, unspecified: Secondary | ICD-10-CM

## 2021-06-07 NOTE — Telephone Encounter (Signed)
Please advise, was last prescribed 03/2020. Thanks  ?

## 2021-06-08 ENCOUNTER — Other Ambulatory Visit: Payer: Self-pay | Admitting: Internal Medicine

## 2021-09-18 ENCOUNTER — Other Ambulatory Visit: Payer: Self-pay | Admitting: Internal Medicine

## 2021-09-18 DIAGNOSIS — B20 Human immunodeficiency virus [HIV] disease: Secondary | ICD-10-CM

## 2021-09-20 ENCOUNTER — Other Ambulatory Visit (HOSPITAL_COMMUNITY): Payer: Self-pay

## 2021-10-25 ENCOUNTER — Other Ambulatory Visit: Payer: Medicare HMO

## 2021-10-25 ENCOUNTER — Other Ambulatory Visit (HOSPITAL_COMMUNITY)
Admission: RE | Admit: 2021-10-25 | Discharge: 2021-10-25 | Disposition: A | Discharge status: Home / Self Care | Payer: Medicare HMO | Source: Ambulatory Visit | Attending: Internal Medicine | Admitting: Internal Medicine

## 2021-10-25 ENCOUNTER — Other Ambulatory Visit: Payer: Self-pay

## 2021-10-25 DIAGNOSIS — Z113 Encounter for screening for infections with a predominantly sexual mode of transmission: Secondary | ICD-10-CM | POA: Diagnosis present

## 2021-10-25 DIAGNOSIS — B20 Human immunodeficiency virus [HIV] disease: Secondary | ICD-10-CM

## 2021-10-26 LAB — T-HELPER CELL (CD4) - (RCID CLINIC ONLY)
CD4 % Helper T Cell: 39 % (ref 33–65)
CD4 T Cell Abs: 733 /uL (ref 400–1790)

## 2021-10-26 LAB — URINE CYTOLOGY ANCILLARY ONLY
Chlamydia: NEGATIVE
Comment: NEGATIVE
Comment: NORMAL
Neisseria Gonorrhea: NEGATIVE

## 2021-10-27 LAB — HIV-1 RNA QUANT-NO REFLEX-BLD
HIV 1 RNA Quant: NOT DETECTED Copies/mL
HIV-1 RNA Quant, Log: NOT DETECTED Log cps/mL

## 2021-10-27 LAB — RPR: RPR Ser Ql: NONREACTIVE

## 2021-11-08 ENCOUNTER — Other Ambulatory Visit (HOSPITAL_COMMUNITY): Payer: Self-pay

## 2021-11-08 ENCOUNTER — Telehealth: Payer: Self-pay

## 2021-11-08 ENCOUNTER — Ambulatory Visit (INDEPENDENT_AMBULATORY_CARE_PROVIDER_SITE_OTHER): Payer: Medicare HMO | Admitting: Internal Medicine

## 2021-11-08 ENCOUNTER — Encounter: Payer: Self-pay | Admitting: Internal Medicine

## 2021-11-08 ENCOUNTER — Other Ambulatory Visit: Payer: Self-pay

## 2021-11-08 DIAGNOSIS — B20 Human immunodeficiency virus [HIV] disease: Secondary | ICD-10-CM

## 2021-11-08 DIAGNOSIS — Z23 Encounter for immunization: Secondary | ICD-10-CM

## 2021-11-08 DIAGNOSIS — Z113 Encounter for screening for infections with a predominantly sexual mode of transmission: Secondary | ICD-10-CM

## 2021-11-08 MED ORDER — DOLUTEGRAVIR-LAMIVUDINE 50-300 MG PO TABS
1.0000 | ORAL_TABLET | Freq: Every day | ORAL | 11 refills | Status: DC
  Filled 2021-11-08 (×4): qty 30, 30d supply, fill #0
  Filled 2021-11-28: qty 30, 30d supply, fill #1
  Filled 2021-12-22: qty 30, 30d supply, fill #2
  Filled 2022-01-24: qty 30, 30d supply, fill #3
  Filled 2022-02-17: qty 30, 30d supply, fill #4
  Filled 2022-03-22: qty 30, 30d supply, fill #5
  Filled 2022-04-18: qty 30, 30d supply, fill #6
  Filled 2022-05-18: qty 30, 30d supply, fill #7
  Filled 2022-06-14: qty 30, 30d supply, fill #8
  Filled 2022-07-12: qty 30, 30d supply, fill #9
  Filled 2022-08-11: qty 30, 30d supply, fill #10
  Filled 2022-09-07: qty 30, 30d supply, fill #11

## 2021-11-08 NOTE — Telephone Encounter (Signed)
[facility] Patient Advocate Encounter ?  ?I was successful in securing patient a $7500.00 grant from [employer] ([employer]) to provide copayment coverage for Dovato.  This will make the out of pocket cost $$0.00.   ?  ?I have spoken with the patient.   ? ?The billing information is as follows and has been shared with [facility].  ? ? ? ? ? ?Patient knows to call the office with questions or concerns. ? ?Darren Caldron, CPhT ?Specialty Pharmacy Patient Advocate ?[facility] ?Phone: [phone] ?Fax:  [phone]  ?

## 2021-11-08 NOTE — Telephone Encounter (Signed)
Counseled patient on Dovato. Discussed that she will be taking this medication once daily with or without food. Reviewed her medication list and did not note any significant drug-drug interactions except for polyvalent cations with multivitamins. Instructed patient to take her multivitamins either 6 hours before or 2 hours after Dovato to avoid this interaction. Informed her that Dovato prescription has been sent to [facility]. She would prefer this to be delivered. Scheduled patient for 1 month follow up with Cassie.   Eliseo Gum, PharmD PGY1 Pharmacy Resident   11/08/2021  12:12 PM

## 2021-11-08 NOTE — Progress Notes (Signed)
   Subjective:    Patient ID: Helen James Alert, female    DOB: 1955-09-17, 66 y.o.   MRN: [id number]  I connected with  Oliviya A Bouza on 11/08/21 by a video enabled telemedicine application and verified that I am speaking with the correct person using two identifiers.   I discussed the limitations of evaluation and management by telemedicine. The patient expressed understanding and agreed to proceed.  Location: Patient - home Physician - clinic  Duration of visit:  22  HPI Here for follow up of HIV She continues on Atripla and has been reluctant to change.  I have previously educated her on TDF side effects including bone density issues and risk of renal insuffiency.  She remains not detected.  She fell late last year and broke her femur.     Review of Systems  Constitutional:  Negative for fatigue.  Skin:  Negative for rash.       Objective:   Physical Exam Neurological:     Mental Status: She is alert.  Psychiatric:        Mood and Affect: Mood normal.           Assessment & Plan:

## 2021-11-08 NOTE — Assessment & Plan Note (Signed)
Screened negative 

## 2021-11-08 NOTE — Assessment & Plan Note (Addendum)
She continues to do well with Atripla and I again discussed at length the issues with TDF and she is now willing to change to appropriate TAF -based regimen or tenofovir-sparing, depending on insurance coverage as long as there is no significant expense.  I will discuss the changes with pharmacy to verify and consider the change.  She will get labs about 1 month after the change then can follow up with me in one year  I discussed it with pharmacy and ok to change.  Dovato sent in.

## 2021-11-08 NOTE — Assessment & Plan Note (Signed)
She is scheduled with her PCP and will get the flu shot there.

## 2021-11-28 ENCOUNTER — Other Ambulatory Visit (HOSPITAL_COMMUNITY): Payer: Self-pay

## 2021-12-02 ENCOUNTER — Other Ambulatory Visit (HOSPITAL_COMMUNITY): Payer: Self-pay

## 2021-12-05 ENCOUNTER — Other Ambulatory Visit (HOSPITAL_COMMUNITY): Payer: Self-pay

## 2021-12-12 NOTE — Progress Notes (Signed)
12/12/2021  HPI: Helen James is a 66 y.o. female who presents to the [place] clinic for HIV follow-up.  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   BIOTIN 5000 MCG TABS    Take 5,000 mcg by mouth daily.   CALCIUM PO    Take by mouth.   CYANOCOBALAMIN (VITAMIN B-12 PO)    Take 1 tablet by mouth daily.   DIPHENHYDRAMINE (BENADRYL) 25 MG TABLET    Take 1 tablet (25 mg total) by mouth 3 (three) times daily. Take one tablet three times daily for two days   DOLUTEGRAVIR-LAMIVUDINE (DOVATO) 50-300 MG TABLET    Take 1 tablet by mouth daily.   MAGNESIUM PO    Take 125 mg by mouth 2 (two) times daily.   MULTIPLE VITAMIN (MULTIVITAMIN) TABLET    Take 1 tablet by mouth daily.   POLYETHYLENE GLYCOL (MIRALAX) 17 G PACKET    Take 17 g by mouth daily as needed.   URSODIOL (ACTIGALL) 250 MG TABLET    Take 250 mg by mouth 2 (two) times daily.  Modified Medications   No medications on file  Discontinued Medications   No medications on file    Allergies: Allergies  Allergen Reactions   Ramipril Swelling    Facial swelling   Aspirin Tinitus   Feldene [Piroxicam] Other (See Comments)    States  causes eyelids to "jump"   Latex Itching   Nsaids Other (See Comments)    Ears ringing   Percocet [Oxycodone-Acetaminophen] Other (See Comments)    Hallucinations per patient   Tylenol [Acetaminophen] Itching    Pt states itching only occurred when taken with Oxycodone.  No reaction with tylenol alone    Past Medical History: Past Medical History:  Diagnosis Date   DM2 (diabetes mellitus, type 2) ([place])    HIV disease ([place]) 12/03/2015   HTN (hypertension)    Mixed hyperlipidemia    Morbid obesity with BMI of 40.0-44.9, adult (HCC)     Social History: Social History   Socioeconomic History   Marital status: Single    Spouse name: Not on file   Number of children: Not on file   Years of education: Not on file   Highest education level: Not on file  Occupational History   Not on file  Tobacco Use   Smoking status: Never   Smokeless tobacco: Never  Substance and Sexual Activity   Alcohol use: No   Drug use: No   Sexual activity: Not Currently  Other Topics Concern   Not on file  Social History Narrative   Not on file   Social Determinants of Health   Financial  Resource Strain: Not on file  Food Insecurity: Not on file  Transportation Needs: Not on file  Physical Activity: Not on file  Stress: Not on file  Social Connections: Not on file    Labs: Lab Results  Component Value Date   HIV1RNAQUANT Not Detected 10/25/2021   HIV1RNAQUANT <20 12/20/2020   HIV1RNAQUANT Not Detected 10/18/2020   CD4TABS 733 10/25/2021   CD4TABS 727 12/20/2020   CD4TABS 939 10/18/2020    RPR and STI Lab Results  Component Value Date   LABRPR NON-REACTIVE 10/25/2021   LABRPR NON-REACTIVE 10/18/2020   LABRPR NON-REACTIVE 10/28/2019   LABRPR NON-REACTIVE 10/14/2018   LABRPR NON-REACTIVE 10/04/2017    STI Results GC CT  10/25/2021 10:07 AM Negative  Negative   10/18/2020  9:38 AM Negative  Negative   10/28/2019  8:56 AM Negative  Negative   10/14/2018 12:00 AM Negative   Negative   10/18/2017 12:00 AM Negative  Negative   10/04/2017 12:00 AM Negative  Negative   12/07/2015 12:00 AM Negative  Negative     Hepatitis B Lab Results  Component Value Date   HEPBSAB NEG 12/07/2015   HEPBSAG NEGATIVE 12/07/2015   HEPBCAB NON REACTIVE 12/07/2015   Hepatitis C No results found for: "HEPCAB", "HCVRNAPCRQN" Hepatitis A Lab Results  Component Value Date   HAV NON REACTIVE 12/07/2015   Lipids: Lab Results  Component Value Date   CHOL 175 09/19/2016   TRIG 61 09/19/2016   HDL 85 09/19/2016   CHOLHDL 2.1 09/19/2016   VLDL 12 09/19/2016   LDLCALC 78 09/19/2016    Current HIV Regimen: Dovato  Assessment: Helen James is here for her 92-month follow up after switching from Atripla to Dovato. She reports the timing of her vitamins with Dovato has been bothersome and she expressed the desire to go back to Atripla. We reviewed the risk of osteoporosis and bone deterioration that TDF, a component of Atripla, can cause and expressed our concerns especially in light of her recent fragility fracture and Dexa Scan confirming low bone-mineral density. She is open to taking her vitamins and supplements in the morning and taking Dovato at night after discussing this option. She reports some more itching that isn't bothersome, but no other side effects. We discussed recent treatment options approved for osteoporosis, but left the bulk of the discussion for her and her PCP at her next visit 12/6. We will collect an HIV viral load since she switched HIV therapy last month.  She is eligible for the Hep B, Tdap, flu and COVID vaccines. After discussion of the risks and benefits, she reports having the COVID, Tdap, RSV and flu vaccines and she is waiting for her records to be checked with PCP 12/6 before getting the Hep B vaccine.   Plan: - Collect HIV RNA today - Follow up with Dr. Luciana Axe in 1 year  Jacolyn Reedy, Student Pharm-D [facility]

## 2021-12-13 ENCOUNTER — Ambulatory Visit (INDEPENDENT_AMBULATORY_CARE_PROVIDER_SITE_OTHER): Payer: Medicare HMO | Admitting: Pharmacist

## 2021-12-13 ENCOUNTER — Other Ambulatory Visit: Payer: Self-pay

## 2021-12-13 DIAGNOSIS — B20 Human immunodeficiency virus [HIV] disease: Secondary | ICD-10-CM

## 2021-12-15 LAB — HIV-1 RNA QUANT-NO REFLEX-BLD
HIV 1 RNA Quant: NOT DETECTED Copies/mL
HIV-1 RNA Quant, Log: NOT DETECTED Log cps/mL

## 2021-12-22 ENCOUNTER — Other Ambulatory Visit (HOSPITAL_COMMUNITY): Payer: Self-pay

## 2021-12-26 ENCOUNTER — Other Ambulatory Visit (HOSPITAL_COMMUNITY): Payer: Self-pay

## 2022-01-24 ENCOUNTER — Other Ambulatory Visit (HOSPITAL_COMMUNITY): Payer: Self-pay

## 2022-01-25 ENCOUNTER — Other Ambulatory Visit: Payer: Self-pay

## 2022-02-17 ENCOUNTER — Other Ambulatory Visit (HOSPITAL_COMMUNITY): Payer: Self-pay

## 2022-02-23 ENCOUNTER — Other Ambulatory Visit: Payer: Self-pay

## 2022-03-22 ENCOUNTER — Other Ambulatory Visit (HOSPITAL_COMMUNITY): Payer: Self-pay

## 2022-03-23 ENCOUNTER — Other Ambulatory Visit (HOSPITAL_COMMUNITY): Payer: Self-pay

## 2022-04-18 ENCOUNTER — Other Ambulatory Visit (HOSPITAL_COMMUNITY): Payer: Self-pay

## 2022-04-24 ENCOUNTER — Other Ambulatory Visit: Payer: Self-pay

## 2022-05-18 ENCOUNTER — Other Ambulatory Visit (HOSPITAL_COMMUNITY): Payer: Self-pay

## 2022-05-19 ENCOUNTER — Other Ambulatory Visit (HOSPITAL_COMMUNITY): Payer: Self-pay

## 2022-06-10 ENCOUNTER — Other Ambulatory Visit: Payer: Self-pay | Admitting: Internal Medicine

## 2022-06-10 DIAGNOSIS — B009 Herpesviral infection, unspecified: Secondary | ICD-10-CM

## 2022-06-14 ENCOUNTER — Other Ambulatory Visit (HOSPITAL_COMMUNITY): Payer: Self-pay

## 2022-07-12 ENCOUNTER — Other Ambulatory Visit (HOSPITAL_COMMUNITY): Payer: Self-pay

## 2022-07-17 ENCOUNTER — Other Ambulatory Visit (HOSPITAL_COMMUNITY): Payer: Self-pay

## 2022-07-18 ENCOUNTER — Other Ambulatory Visit (HOSPITAL_COMMUNITY): Payer: Self-pay

## 2022-07-25 ENCOUNTER — Other Ambulatory Visit (HOSPITAL_COMMUNITY): Payer: Self-pay

## 2022-08-11 ENCOUNTER — Other Ambulatory Visit (HOSPITAL_COMMUNITY): Payer: Self-pay

## 2022-08-15 ENCOUNTER — Other Ambulatory Visit (HOSPITAL_COMMUNITY): Payer: Self-pay

## 2022-08-17 ENCOUNTER — Other Ambulatory Visit (HOSPITAL_COMMUNITY): Payer: Self-pay

## 2022-09-07 ENCOUNTER — Other Ambulatory Visit (HOSPITAL_COMMUNITY): Payer: Self-pay

## 2022-09-12 ENCOUNTER — Other Ambulatory Visit (HOSPITAL_COMMUNITY): Payer: Self-pay

## 2022-10-02 ENCOUNTER — Other Ambulatory Visit: Payer: Self-pay

## 2022-10-02 ENCOUNTER — Other Ambulatory Visit: Payer: Self-pay | Admitting: Internal Medicine

## 2022-10-02 ENCOUNTER — Other Ambulatory Visit (HOSPITAL_COMMUNITY): Payer: Self-pay

## 2022-10-02 MED ORDER — DOVATO 50-300 MG PO TABS
1.0000 | ORAL_TABLET | Freq: Every day | ORAL | 1 refills | Status: DC
  Filled 2022-10-02: qty 30, 30d supply, fill #0
  Filled 2022-10-31: qty 30, 30d supply, fill #1

## 2022-10-09 ENCOUNTER — Other Ambulatory Visit (HOSPITAL_COMMUNITY): Payer: Self-pay

## 2022-10-31 ENCOUNTER — Other Ambulatory Visit (HOSPITAL_COMMUNITY)
Admission: RE | Admit: 2022-10-31 | Discharge: 2022-10-31 | Disposition: A | Discharge status: Home / Self Care | Payer: Medicare HMO | Source: Ambulatory Visit | Attending: Internal Medicine | Admitting: Internal Medicine

## 2022-10-31 ENCOUNTER — Other Ambulatory Visit: Payer: Medicare HMO

## 2022-10-31 ENCOUNTER — Other Ambulatory Visit (HOSPITAL_COMMUNITY): Payer: Self-pay

## 2022-10-31 ENCOUNTER — Other Ambulatory Visit (HOSPITAL_COMMUNITY): Payer: Self-pay | Admitting: Pharmacy Technician

## 2022-10-31 ENCOUNTER — Other Ambulatory Visit: Payer: Self-pay

## 2022-10-31 ENCOUNTER — Ambulatory Visit (INDEPENDENT_AMBULATORY_CARE_PROVIDER_SITE_OTHER): Payer: Medicare HMO

## 2022-10-31 DIAGNOSIS — Z23 Encounter for immunization: Secondary | ICD-10-CM | POA: Diagnosis not present

## 2022-10-31 DIAGNOSIS — Z113 Encounter for screening for infections with a predominantly sexual mode of transmission: Secondary | ICD-10-CM

## 2022-10-31 DIAGNOSIS — B20 Human immunodeficiency virus [HIV] disease: Secondary | ICD-10-CM

## 2022-10-31 NOTE — Progress Notes (Signed)
Specialty Pharmacy Refill Coordination Note  Helen James is a 67 y.o. female contacted today regarding refills of specialty medication(s) Dolutegravir-Lamivudine   Patient requested Delivery   Delivery date: 11/08/22   Verified address: [address] [place]   Medication will be filled on 11/07/22.

## 2022-11-01 LAB — T-HELPER CELL (CD4) - (RCID CLINIC ONLY)
CD4 % Helper T Cell: 37 % (ref 33–65)
CD4 T Cell Abs: 865 /uL (ref 400–1790)

## 2022-11-01 LAB — URINE CYTOLOGY ANCILLARY ONLY
Chlamydia: NEGATIVE
Comment: NEGATIVE
Comment: NORMAL
Neisseria Gonorrhea: NEGATIVE

## 2022-11-03 LAB — HIV-1 RNA QUANT-NO REFLEX-BLD
HIV 1 RNA Quant: NOT DETECTED {copies}/mL
HIV-1 RNA Quant, Log: NOT DETECTED {Log}

## 2022-11-03 LAB — RPR: RPR Ser Ql: NONREACTIVE

## 2022-11-07 ENCOUNTER — Other Ambulatory Visit: Payer: Self-pay

## 2022-11-14 ENCOUNTER — Telehealth: Payer: Medicare HMO | Admitting: Internal Medicine

## 2022-11-14 ENCOUNTER — Telehealth: Payer: Self-pay | Admitting: Internal Medicine

## 2022-11-14 ENCOUNTER — Other Ambulatory Visit: Payer: Self-pay

## 2022-11-14 ENCOUNTER — Encounter: Payer: Self-pay | Admitting: Internal Medicine

## 2022-11-14 DIAGNOSIS — Z113 Encounter for screening for infections with a predominantly sexual mode of transmission: Secondary | ICD-10-CM

## 2022-11-14 DIAGNOSIS — Z5181 Encounter for therapeutic drug level monitoring: Secondary | ICD-10-CM

## 2022-11-14 DIAGNOSIS — B20 Human immunodeficiency virus [HIV] disease: Secondary | ICD-10-CM

## 2022-11-14 NOTE — Progress Notes (Signed)
   Subjective:   I connected with  Irisa A Winecoff on 11/14/22 by a video enabled telemedicine application and verified that I am speaking with the correct person using two identifiers.   I discussed the limitations of evaluation and management by telemedicine. The patient expressed understanding and agreed to proceed.  Location: Patient - home Physician - clinic  Duration of visit:  14 minutes   Patient ID: Milana Huntsman, female    DOB: 1955-02-27, 67 y.o.   MRN: [id number]  HPI Floriene is seen for folllow up of HIV She has been on Dovato after changing last year from Atripla and doing well.  No new issues.   No complaints today.  No issues getting or taking her medication.   Had labs done by outside physician as well and reviewed in careEverywhere   Review of Systems  Constitutional:  Negative for fatigue.       Objective:   Physical Exam Neurological:     Mental Status: She is alert.           Assessment & Plan:

## 2022-11-14 NOTE — Assessment & Plan Note (Signed)
Doing well, no concerns.  Lab results reviewed with her.  Refills provided Follow up in 11 months

## 2022-11-14 NOTE — Assessment & Plan Note (Signed)
Screened negatie

## 2022-11-29 ENCOUNTER — Other Ambulatory Visit: Payer: Self-pay | Admitting: Internal Medicine

## 2022-11-29 ENCOUNTER — Other Ambulatory Visit: Payer: Self-pay

## 2022-11-29 ENCOUNTER — Other Ambulatory Visit (HOSPITAL_COMMUNITY): Payer: Self-pay

## 2022-11-29 LAB — COLOGUARD: COLOGUARD: NEGATIVE

## 2022-11-29 LAB — EXTERNAL GENERIC LAB PROCEDURE: COLOGUARD: NEGATIVE

## 2022-11-29 MED ORDER — DOVATO 50-300 MG PO TABS
1.0000 | ORAL_TABLET | Freq: Every day | ORAL | 11 refills | Status: DC
  Filled 2022-11-29: qty 30, 30d supply, fill #0
  Filled 2022-12-25: qty 30, 30d supply, fill #1
  Filled 2023-01-23: qty 30, 30d supply, fill #2
  Filled 2023-02-15: qty 30, 30d supply, fill #3
  Filled 2023-03-16: qty 30, 30d supply, fill #4
  Filled 2023-04-23: qty 30, 30d supply, fill #5
  Filled 2023-05-18: qty 30, 30d supply, fill #6
  Filled 2023-06-13: qty 30, 30d supply, fill #7
  Filled 2023-07-16: qty 30, 30d supply, fill #8
  Filled 2023-08-15: qty 30, 30d supply, fill #9
  Filled 2023-09-07 – 2023-09-10 (×2): qty 30, 30d supply, fill #10
  Filled 2023-10-12: qty 30, 30d supply, fill #11

## 2022-11-29 NOTE — Progress Notes (Signed)
Specialty Pharmacy Refill Coordination Note  Helen James is a 67 y.o. female contacted today regarding refills of specialty medication(s) Dolutegravir-Lamivudine   Patient requested Delivery   Delivery date: 12/05/22   Verified address: [address] [place]   Medication will be filled on 12/04/22.

## 2022-11-29 NOTE — Addendum Note (Signed)
Addended by: Jennette Kettle on: 11/29/2022 01:06 PM   Modules accepted: Orders

## 2022-11-30 ENCOUNTER — Other Ambulatory Visit: Payer: Medicare HMO

## 2022-12-04 ENCOUNTER — Other Ambulatory Visit: Payer: Self-pay

## 2022-12-14 ENCOUNTER — Ambulatory Visit: Payer: Self-pay | Admitting: Internal Medicine

## 2022-12-25 ENCOUNTER — Other Ambulatory Visit (HOSPITAL_COMMUNITY): Payer: Self-pay | Admitting: Pharmacy Technician

## 2022-12-25 ENCOUNTER — Other Ambulatory Visit (HOSPITAL_COMMUNITY): Payer: Self-pay

## 2022-12-25 NOTE — Progress Notes (Signed)
Specialty Pharmacy Refill Coordination Note  Helen James is a 67 y.o. female contacted today regarding refills of specialty medication(s) Dolutegravir-Lamivudine   Patient requested Delivery   Delivery date: 01/03/23   Verified address: [address]  [place]    Medication will be filled on 01/02/23.

## 2023-01-02 ENCOUNTER — Other Ambulatory Visit: Payer: Self-pay

## 2023-01-23 ENCOUNTER — Other Ambulatory Visit (HOSPITAL_COMMUNITY): Payer: Self-pay

## 2023-01-23 NOTE — Progress Notes (Signed)
 Specialty Pharmacy Refill Coordination Note  Helen James is a 67 y.o. female contacted today regarding refills of specialty medication(s) Dolutegravir -lamiVUDine  (Dovato )   Patient requested Delivery   Delivery date: 01/30/23   Verified address: [address] [place] [address]   Medication will be filled on 01/29/23.

## 2023-01-29 ENCOUNTER — Other Ambulatory Visit (HOSPITAL_COMMUNITY): Payer: Self-pay

## 2023-01-29 ENCOUNTER — Telehealth: Payer: Self-pay

## 2023-01-29 ENCOUNTER — Other Ambulatory Visit: Payer: Self-pay

## 2023-01-29 NOTE — Telephone Encounter (Signed)
[facility] Patient Advocate Encounter   I was successful in securing patient a $5000.00 grant from [employer] ([employer]) to provide copayment coverage for Dovato.  This will make the out of pocket cost $0.00.     I have spoken with the patient.    The billing information is as follows and has been shared with [facility].         Patient knows to call the office with questions or concerns.  Clearance Coots, CPhT Specialty Pharmacy Patient [facility] Phone: [phone] Fax:  [phone]

## 2023-02-15 ENCOUNTER — Other Ambulatory Visit: Payer: Self-pay

## 2023-02-15 NOTE — Progress Notes (Signed)
Specialty Pharmacy Refill Coordination Note  Helen James is a 68 y.o. female contacted today regarding refills of specialty medication(s) Dolutegravir-lamiVUDine (Dovato)   Patient requested Delivery   Delivery date: 02/26/23   Verified address: [address]  [place] [address]   Medication will be filled on 02/23/23.

## 2023-02-23 ENCOUNTER — Other Ambulatory Visit: Payer: Self-pay

## 2023-03-16 ENCOUNTER — Other Ambulatory Visit: Payer: Self-pay

## 2023-03-16 NOTE — Progress Notes (Signed)
Specialty Pharmacy Refill Coordination Note  Helen James is a 68 y.o. female contacted today regarding refills of specialty medication(s) Dolutegravir-lamiVUDine (Dovato)   Patient requested Delivery   Delivery date: 03/20/23   Verified address: [address]  [place] [address]   Medication will be filled on 03/19/23.

## 2023-03-16 NOTE — Progress Notes (Signed)
Specialty Pharmacy Ongoing Clinical Assessment Note  Helen James is a 68 y.o. female who is being followed by the specialty pharmacy service for RxSp HIV   Patient's specialty medication(s) reviewed today: Dolutegravir-lamiVUDine (Dovato)   Missed doses in the last 4 weeks: 0   Patient/Caregiver did not have any additional questions or concerns.   Therapeutic benefit summary: Patient is achieving benefit   Adverse events/side effects summary: No adverse events/side effects   Patient's therapy is appropriate to: Continue    Goals Addressed             This Visit's Progress    Achieve Undetectable HIV Viral Load < 20       Patient is on track. Patient will maintain adherence. Viral load remains undetectable long term.           Follow up:  6 months  Otto Herb Specialty Pharmacist

## 2023-03-19 ENCOUNTER — Other Ambulatory Visit (HOSPITAL_COMMUNITY): Payer: Self-pay

## 2023-04-01 ENCOUNTER — Other Ambulatory Visit: Payer: Self-pay | Admitting: Internal Medicine

## 2023-04-01 DIAGNOSIS — B009 Herpesviral infection, unspecified: Secondary | ICD-10-CM

## 2023-04-09 ENCOUNTER — Other Ambulatory Visit: Payer: Self-pay

## 2023-04-15 IMAGING — RF DG C-ARM 1-60 MIN-NO REPORT
1 series · 7 of 7 positions shown · non-contrast
Comparison: Right femur radiographs-12/17/2020

FLUOROSCOPY TIME:  3 minutes, 23 seconds (7 mGy)

CLINICAL DATA: ORIF right femur fracture

EXAM:
RIGHT FEMUR 2 VIEWS; DG C-ARM 1-60 MIN-NO REPORT

[Series 1: run · 7 of 7 slices shown]
[im 1/7]
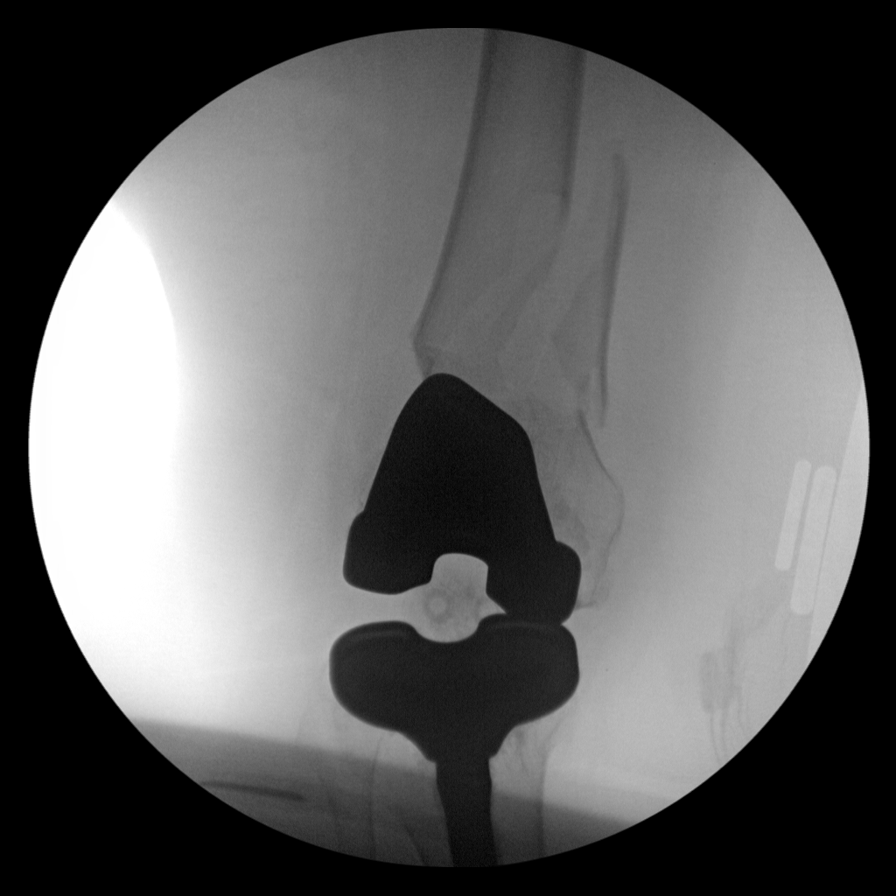
[im 2/7]
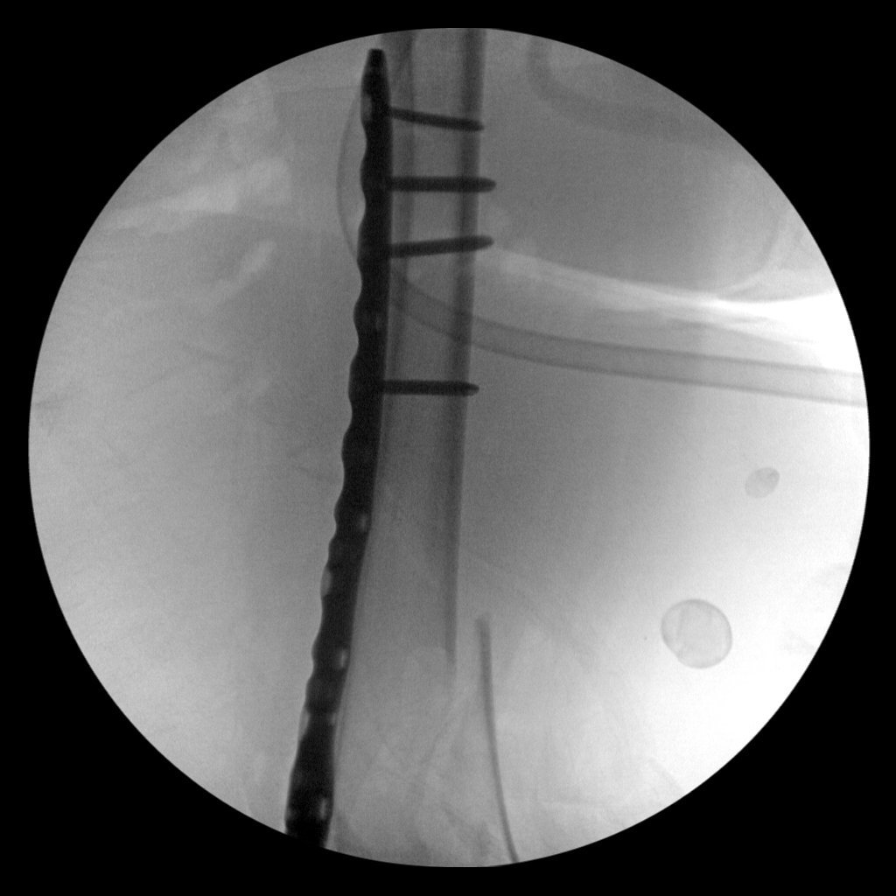
[im 3/7]
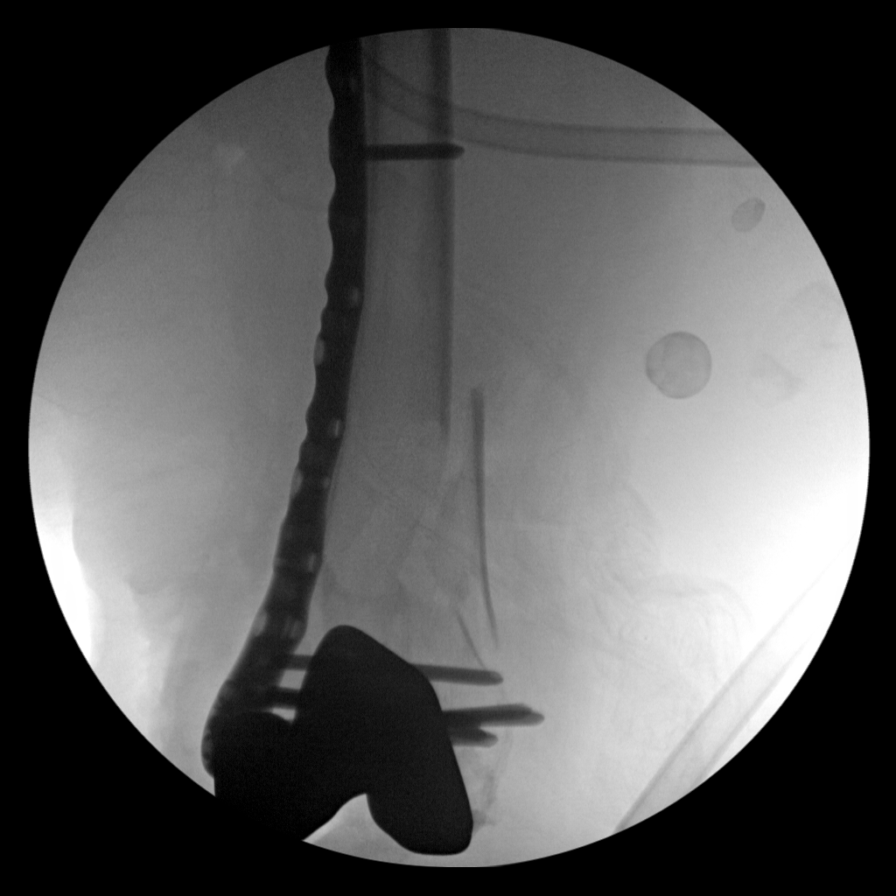
[im 4/7]
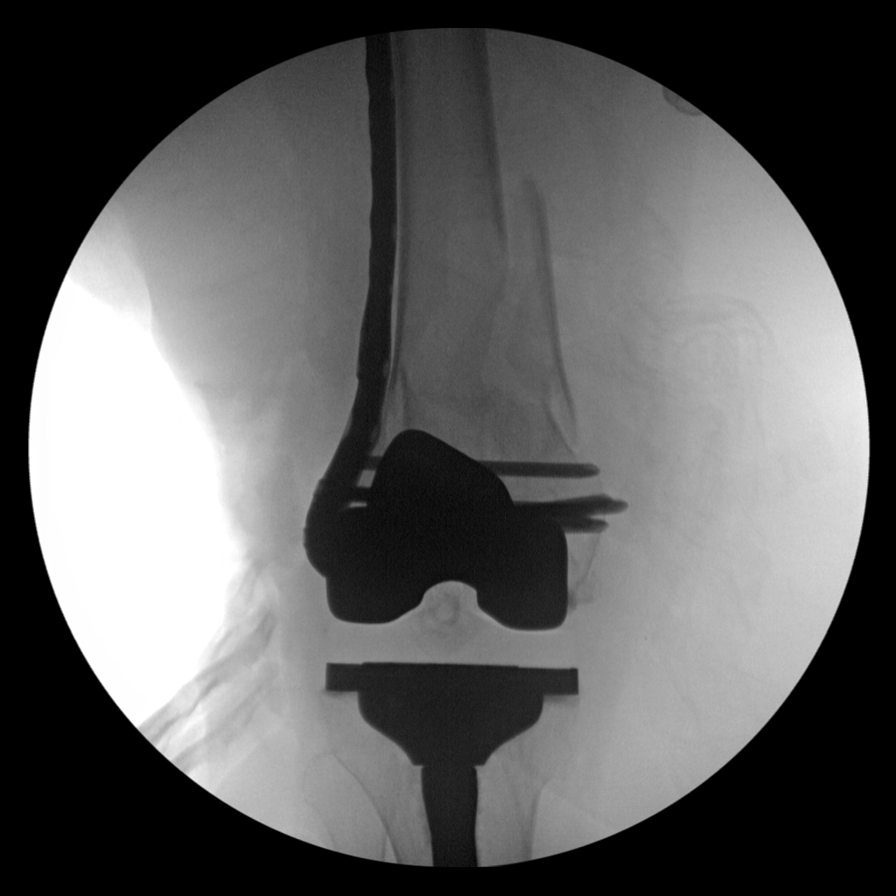
[im 5/7]
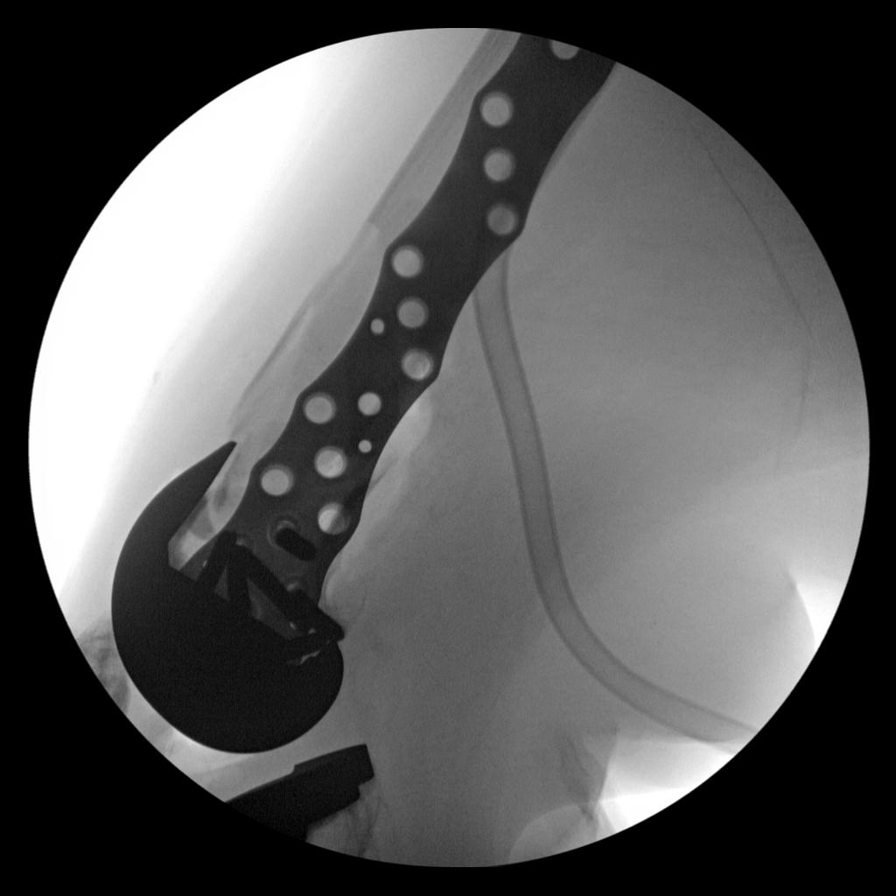
[im 6/7]
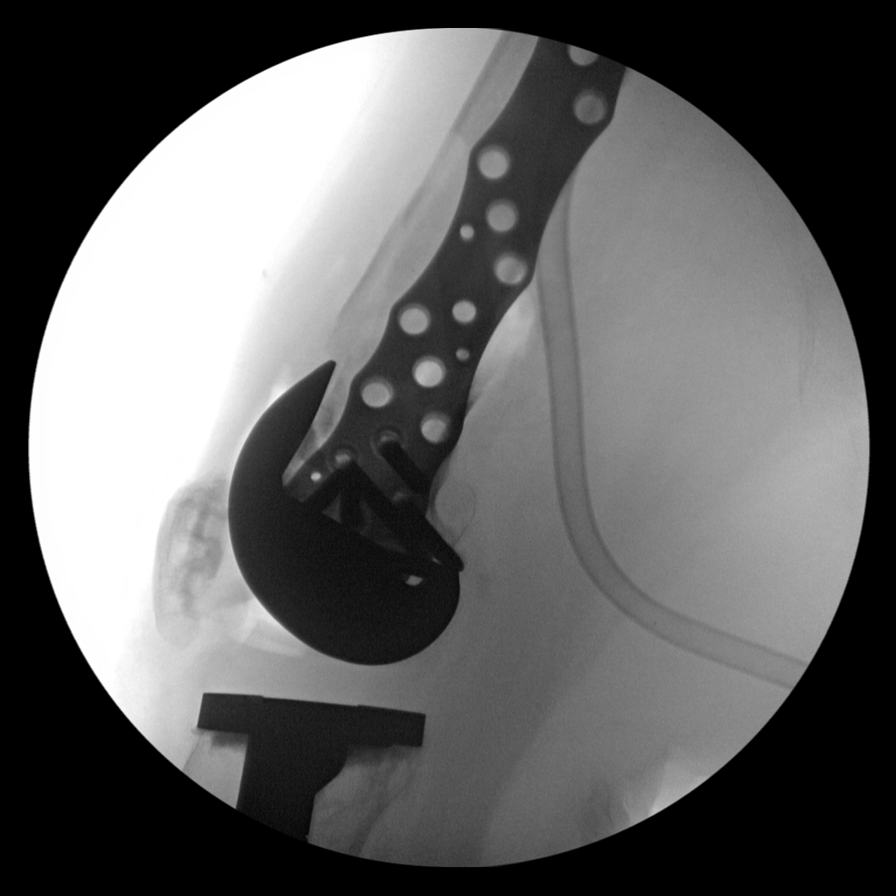
[im 7/7]
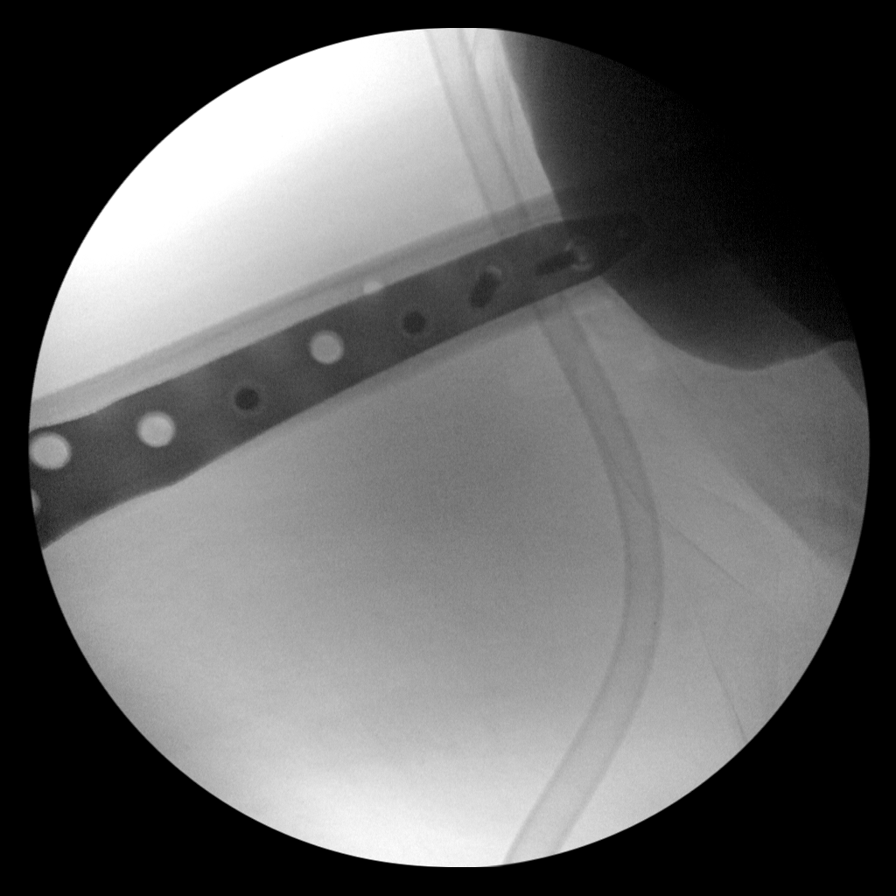

[7 of 7 positions shown; findings below may reference images not displayed]

FINDINGS: 7 spot intraoperative fluoroscopic images of the distal aspect of
the right femur are provided for review and demonstrate the sequela
of sideplate fixation of comminuted distal periprosthetic femur
fracture.

There is improved alignment of the fracture fragments with
persistent mild displacement of the medially located fracture
fragment. Stable sequela of previous right total knee replacement,
incompletely evaluated.

There is expected subcutaneous emphysema about the operative site.
No radiopaque foreign body.
IMPRESSION: Post sideplate fixation of comminuted distal periprosthetic femur
fracture without evidence of complication.

## 2023-04-23 ENCOUNTER — Other Ambulatory Visit: Payer: Self-pay

## 2023-04-23 ENCOUNTER — Other Ambulatory Visit: Payer: Self-pay | Admitting: Pharmacy Technician

## 2023-04-23 NOTE — Progress Notes (Signed)
 Specialty Pharmacy Refill Coordination Note  Helen James is a 68 y.o. female contacted today regarding refills of specialty medication(s) Dolutegravir-lamiVUDine (Dovato)   Patient requested Delivery   Delivery date: 04/25/23   Verified address: [address]  [name]   Medication will be filled on 04/24/23.

## 2023-04-24 ENCOUNTER — Other Ambulatory Visit: Payer: Self-pay

## 2023-05-18 ENCOUNTER — Other Ambulatory Visit: Payer: Self-pay

## 2023-05-18 ENCOUNTER — Other Ambulatory Visit: Payer: Self-pay | Admitting: Pharmacy Technician

## 2023-05-18 NOTE — Progress Notes (Signed)
 Specialty Pharmacy Refill Coordination Note  Helen James is a 68 y.o. female contacted today regarding refills of specialty medication(s) Dolutegravir -lamiVUDine  (Dovato )   Patient requested Delivery   Delivery date: 05/22/23   Verified address: [address] [place] [address]   Medication will be filled on 05/21/23.

## 2023-05-22 ENCOUNTER — Other Ambulatory Visit: Payer: Self-pay

## 2023-05-22 NOTE — Progress Notes (Signed)
 Clinical Intervention Note  Clinical Intervention Notes: Patient reports initiating Norvasc , no DDIs with Dovato  identified.   Clinical Intervention Outcomes: Prevention of an adverse drug event   Rena Carnes Specialty Pharmacist

## 2023-06-11 ENCOUNTER — Other Ambulatory Visit: Payer: Self-pay

## 2023-06-12 NOTE — Progress Notes (Signed)
 The 10-year ASCVD risk score (Arnett DK, et al., 2019) is: 32.5%   Values used to calculate the score:     Age: 68 years     Sex: Female     Is Non-Hispanic African American: Yes     Diabetic: Yes     Tobacco smoker: No     Systolic Blood Pressure: 163 mmHg     Is BP treated: Yes     HDL Cholesterol: 78 mg/dL     Total Cholesterol: 162 mg/dL  Currently prescribed atorvastatin  10 mg.   Gavyn Ybarra, BSN, RN

## 2023-06-13 ENCOUNTER — Other Ambulatory Visit: Payer: Self-pay | Admitting: Pharmacy Technician

## 2023-06-13 ENCOUNTER — Other Ambulatory Visit: Payer: Self-pay

## 2023-06-13 NOTE — Progress Notes (Signed)
 Specialty Pharmacy Refill Coordination Note  Helen James is a 68 y.o. female contacted today regarding refills of specialty medication(s) Dolutegravir -lamiVUDine  (Dovato )   Patient requested Delivery   Delivery date: 06/15/23   Verified address: Patient address [address]  [place]   Medication will be filled on 06/14/23.

## 2023-06-15 ENCOUNTER — Other Ambulatory Visit (HOSPITAL_COMMUNITY): Payer: Self-pay

## 2023-07-16 ENCOUNTER — Other Ambulatory Visit: Payer: Self-pay | Admitting: Pharmacy Technician

## 2023-07-16 ENCOUNTER — Other Ambulatory Visit: Payer: Self-pay

## 2023-07-16 NOTE — Progress Notes (Signed)
 Specialty Pharmacy Refill Coordination Note  Helen James is a 68 y.o. female contacted today regarding refills of specialty medication(s) Dolutegravir -lamiVUDine  (Dovato )   Patient requested Delivery   Delivery date: 07/19/23   Verified address: [address]  [place]   Medication will be filled on 07/18/23.

## 2023-07-17 ENCOUNTER — Other Ambulatory Visit: Payer: Self-pay

## 2023-08-15 ENCOUNTER — Other Ambulatory Visit: Payer: Self-pay

## 2023-08-15 ENCOUNTER — Other Ambulatory Visit (HOSPITAL_COMMUNITY): Payer: Self-pay

## 2023-08-15 ENCOUNTER — Other Ambulatory Visit: Payer: Self-pay | Admitting: Pharmacy Technician

## 2023-08-15 NOTE — Progress Notes (Signed)
 Specialty Pharmacy Refill Coordination Note  Helen James is a 68 y.o. female contacted today regarding refills of specialty medication(s) Dolutegravir -lamiVUDine  (Dovato )   Patient requested Delivery   Delivery date: 08/20/23   Verified address: [address]  [place] [address]   Medication will be filled on 08/17/23.

## 2023-08-17 ENCOUNTER — Other Ambulatory Visit: Payer: Self-pay

## 2023-08-22 ENCOUNTER — Other Ambulatory Visit: Payer: Self-pay | Admitting: Internal Medicine

## 2023-08-22 DIAGNOSIS — B009 Herpesviral infection, unspecified: Secondary | ICD-10-CM

## 2023-08-22 NOTE — Telephone Encounter (Signed)
 Okay to refill? Previous [place] pt, scheduled to see you in October.

## 2023-09-05 ENCOUNTER — Other Ambulatory Visit: Payer: Self-pay

## 2023-09-07 ENCOUNTER — Other Ambulatory Visit: Payer: Self-pay

## 2023-09-10 ENCOUNTER — Other Ambulatory Visit: Payer: Self-pay

## 2023-09-10 ENCOUNTER — Other Ambulatory Visit: Payer: Self-pay | Admitting: Pharmacy Technician

## 2023-09-10 NOTE — Progress Notes (Signed)
 Specialty Pharmacy Refill Coordination Note  Helen James is a 68 y.o. female contacted today regarding refills of specialty medication(s) Dolutegravir -lamiVUDine  (Dovato )   Patient requested Delivery   Delivery date: 09/18/23   Verified address: [address] [place]   Medication will be filled on 09/17/23.

## 2023-09-17 ENCOUNTER — Other Ambulatory Visit: Payer: Self-pay

## 2023-10-12 ENCOUNTER — Other Ambulatory Visit: Payer: Self-pay

## 2023-10-12 NOTE — Progress Notes (Signed)
 Specialty Pharmacy Refill Coordination Note  Helen James is a 68 y.o. female contacted today regarding refills of specialty medication(s) Dolutegravir -lamiVUDine  (Dovato )   Patient requested Delivery   Delivery date: 10/16/23   Verified address: [address]  [place]   Medication will be filled on 09.22.25.

## 2023-11-05 ENCOUNTER — Other Ambulatory Visit: Payer: Self-pay | Admitting: Internal Medicine

## 2023-11-05 DIAGNOSIS — B009 Herpesviral infection, unspecified: Secondary | ICD-10-CM

## 2023-11-06 ENCOUNTER — Other Ambulatory Visit: Payer: Self-pay

## 2023-11-06 DIAGNOSIS — Z79899 Other long term (current) drug therapy: Secondary | ICD-10-CM

## 2023-11-06 DIAGNOSIS — B20 Human immunodeficiency virus [HIV] disease: Secondary | ICD-10-CM

## 2023-11-06 DIAGNOSIS — Z113 Encounter for screening for infections with a predominantly sexual mode of transmission: Secondary | ICD-10-CM

## 2023-11-07 ENCOUNTER — Other Ambulatory Visit: Payer: Self-pay

## 2023-11-07 ENCOUNTER — Other Ambulatory Visit: Payer: Medicare HMO

## 2023-11-07 ENCOUNTER — Other Ambulatory Visit (HOSPITAL_COMMUNITY)
Admission: RE | Admit: 2023-11-07 | Discharge: 2023-11-07 | Disposition: A | Discharge status: Home / Self Care | Source: Ambulatory Visit | Attending: Internal Medicine | Admitting: Internal Medicine

## 2023-11-07 DIAGNOSIS — Z113 Encounter for screening for infections with a predominantly sexual mode of transmission: Secondary | ICD-10-CM

## 2023-11-07 DIAGNOSIS — B20 Human immunodeficiency virus [HIV] disease: Secondary | ICD-10-CM | POA: Insufficient documentation

## 2023-11-07 DIAGNOSIS — Z79899 Other long term (current) drug therapy: Secondary | ICD-10-CM

## 2023-11-08 LAB — T-HELPER CELL (CD4) - (RCID CLINIC ONLY)
CD4 % Helper T Cell: 38 % (ref 33–65)
CD4 T Cell Abs: 912 /uL (ref 400–1790)

## 2023-11-08 LAB — URINE CYTOLOGY ANCILLARY ONLY
Chlamydia: NEGATIVE
Comment: NEGATIVE
Comment: NORMAL
Neisseria Gonorrhea: NEGATIVE

## 2023-11-09 ENCOUNTER — Other Ambulatory Visit: Payer: Self-pay

## 2023-11-09 ENCOUNTER — Other Ambulatory Visit: Payer: Self-pay | Admitting: Pharmacist

## 2023-11-09 DIAGNOSIS — B20 Human immunodeficiency virus [HIV] disease: Secondary | ICD-10-CM

## 2023-11-09 LAB — COMPLETE METABOLIC PANEL WITHOUT GFR
AG Ratio: 1.5 (calc) (ref 1.0–2.5)
ALT: 11 U/L (ref 6–29)
AST: 19 U/L (ref 10–35)
Albumin: 3.9 g/dL (ref 3.6–5.1)
Alkaline phosphatase (APISO): 117 U/L (ref 37–153)
BUN: 20 mg/dL (ref 7–25)
CO2: 22 mmol/L (ref 20–32)
Calcium: 9.1 mg/dL (ref 8.6–10.4)
Chloride: 112 mmol/L — ABNORMAL HIGH (ref 98–110)
Creat: 1.03 mg/dL (ref 0.50–1.05)
Globulin: 2.6 g/dL (ref 1.9–3.7)
Glucose, Bld: 84 mg/dL (ref 65–99)
Potassium: 4.5 mmol/L (ref 3.5–5.3)
Sodium: 141 mmol/L (ref 135–146)
Total Bilirubin: 0.4 mg/dL (ref 0.2–1.2)
Total Protein: 6.5 g/dL (ref 6.1–8.1)

## 2023-11-09 LAB — LIPID PANEL
Cholesterol: 131 mg/dL (ref <200)
HDL: 69 mg/dL (ref >=50)
LDL Cholesterol (Calc): 46 mg/dL
Non-HDL Cholesterol (Calc): 62 mg/dL (ref <130)
Total CHOL/HDL Ratio: 1.9 (calc) (ref <5.0)
Triglycerides: 75 mg/dL (ref <150)

## 2023-11-09 LAB — CBC WITH DIFFERENTIAL/PLATELET
Absolute Lymphocytes: 2798 {cells}/uL (ref 850–3900)
Absolute Monocytes: 470 {cells}/uL (ref 200–950)
Basophils Absolute: 20 {cells}/uL (ref 0–200)
Basophils Relative: 0.4 %
Eosinophils Absolute: 49 {cells}/uL (ref 15–500)
Eosinophils Relative: 1 %
HCT: 37.2 % (ref 35.0–45.0)
Hemoglobin: 12.2 g/dL (ref 11.7–15.5)
MCH: 34.6 pg — ABNORMAL HIGH (ref 27.0–33.0)
MCHC: 32.8 g/dL (ref 32.0–36.0)
MCV: 105.4 fL — ABNORMAL HIGH (ref 80.0–100.0)
MPV: 10.6 fL (ref 7.5–12.5)
Monocytes Relative: 9.6 %
Neutro Abs: 1563 {cells}/uL (ref 1500–7800)
Neutrophils Relative %: 31.9 %
Platelets: 263 Thousand/uL (ref 140–400)
RBC: 3.53 Million/uL — ABNORMAL LOW (ref 3.80–5.10)
RDW: 12.2 % (ref 11.0–15.0)
Total Lymphocyte: 57.1 %
WBC: 4.9 Thousand/uL (ref 3.8–10.8)

## 2023-11-09 LAB — HIV-1 RNA QUANT-NO REFLEX-BLD
HIV 1 RNA Quant: 79 {copies}/mL — ABNORMAL HIGH
HIV-1 RNA Quant, Log: 1.9 {Log_copies}/mL — ABNORMAL HIGH

## 2023-11-09 LAB — RPR: RPR Ser Ql: NONREACTIVE

## 2023-11-09 NOTE — Progress Notes (Signed)
 Specialty Pharmacy Refill Coordination Note  Helen James is a 68 y.o. female contacted today regarding refills of specialty medication(s) Dolutegravir -lamiVUDine  (Dovato )   Patient requested Delivery   Delivery date: 11/14/23   Verified address: [address] [place]   Medication will be filled on 11/13/23.   This fill date is pending response to refill request from provider. Patient is aware and if they have not received fill by intended date they must follow up with pharmacy.

## 2023-11-13 ENCOUNTER — Other Ambulatory Visit: Payer: Self-pay

## 2023-11-13 NOTE — Telephone Encounter (Signed)
Appt 10/29 

## 2023-11-14 ENCOUNTER — Other Ambulatory Visit: Payer: Self-pay

## 2023-11-14 MED ORDER — DOVATO 50-300 MG PO TABS
1.0000 | ORAL_TABLET | Freq: Every day | ORAL | 0 refills | Status: DC
  Filled 2023-11-14: qty 30, 30d supply, fill #0

## 2023-11-15 ENCOUNTER — Other Ambulatory Visit (HOSPITAL_COMMUNITY): Payer: Self-pay

## 2023-11-15 ENCOUNTER — Other Ambulatory Visit: Payer: Self-pay

## 2023-11-21 ENCOUNTER — Telehealth: Payer: Medicare HMO | Admitting: Internal Medicine

## 2023-11-21 ENCOUNTER — Telehealth (INDEPENDENT_AMBULATORY_CARE_PROVIDER_SITE_OTHER): Payer: Self-pay | Admitting: Internal Medicine

## 2023-11-21 ENCOUNTER — Encounter: Payer: Self-pay | Admitting: Internal Medicine

## 2023-11-21 DIAGNOSIS — B20 Human immunodeficiency virus [HIV] disease: Secondary | ICD-10-CM

## 2023-11-21 DIAGNOSIS — Z538 Procedure and treatment not carried out for other reasons: Secondary | ICD-10-CM

## 2023-11-21 NOTE — Progress Notes (Signed)
 canceled

## 2023-11-21 NOTE — Patient Instructions (Signed)
 Follow up in one year, please make appt lab 2 weeks prior to appt

## 2023-11-27 NOTE — Progress Notes (Signed)
 Chart reviewed complete for [employer] forms project.  Form submitted for AWV DOS 11/14/2023.

## 2023-11-28 ENCOUNTER — Encounter: Payer: Self-pay | Admitting: Internal Medicine

## 2023-11-28 ENCOUNTER — Other Ambulatory Visit (HOSPITAL_COMMUNITY): Payer: Self-pay

## 2023-11-28 ENCOUNTER — Ambulatory Visit (INDEPENDENT_AMBULATORY_CARE_PROVIDER_SITE_OTHER): Admitting: Internal Medicine

## 2023-11-28 ENCOUNTER — Other Ambulatory Visit: Payer: Self-pay

## 2023-11-28 VITALS — HR 64 | Temp 97.4°F | Ht 63.0 in | Wt 275.0 lb

## 2023-11-28 DIAGNOSIS — Z23 Encounter for immunization: Secondary | ICD-10-CM

## 2023-11-28 DIAGNOSIS — Z79899 Other long term (current) drug therapy: Secondary | ICD-10-CM

## 2023-11-28 DIAGNOSIS — Z7185 Encounter for immunization safety counseling: Secondary | ICD-10-CM | POA: Diagnosis not present

## 2023-11-28 DIAGNOSIS — B20 Human immunodeficiency virus [HIV] disease: Secondary | ICD-10-CM

## 2023-11-28 MED ORDER — DOVATO 50-300 MG PO TABS
1.0000 | ORAL_TABLET | Freq: Every day | ORAL | 11 refills | Status: AC
  Filled 2023-11-28 – 2023-12-07 (×3): qty 30, 30d supply, fill #0
  Filled 2024-01-03: qty 30, 30d supply, fill #1
  Filled 2024-01-31 – 2024-02-06 (×2): qty 30, 30d supply, fill #2

## 2023-11-28 NOTE — Progress Notes (Signed)
 [facility]     HPI: Helen James is a 68 y.o. female presents for HIV management.   Past Medical History:  Diagnosis Date   DM2 (diabetes mellitus, type 2) (HCC)    HIV disease (HCC) 12/03/2015   HTN (hypertension)    Mixed hyperlipidemia    Morbid obesity with BMI of 40.0-44.9, adult [facility])     Past Surgical History:  Procedure Laterality Date   GASTRIC BYPASS OPEN  2006   HAMMER TOE SURGERY     ORIF FEMUR FRACTURE Right 12/18/2020   Procedure: OPEN REDUCTION INTERNAL FIXATION (ORIF) DISTAL FEMUR FRACTURE;  Surgeon: Edna Toribio LABOR, MD;  Location: [facility] OR;  Service: Orthopedics;  Laterality: Right;   ORIF PERIPROSTHETIC FRACTURE Right 12/18/2020   Procedure: OPEN REDUCTION INTERNAL FIXATION (ORIF) PERIPROSTHETIC FRACTURE;  Surgeon: Edna Toribio LABOR, MD;  Location: [facility] OR;  Service: Orthopedics;  Laterality: Right;   TOTAL KNEE ARTHROPLASTY Right 2009    Family History  Problem Relation Age of Onset   CAD Mother    Diabetes Other    Current Outpatient Medications on File Prior to Visit  Medication Sig Dispense Refill   acyclovir  (ZOVIRAX ) 400 MG tablet TAKE 1 TABLET TWICE DAILY 180 tablet 0   amLODipine  (NORVASC ) 2.5 MG tablet Take 2.5 mg by mouth daily.     aspirin EC 81 MG tablet Take by mouth.     atorvastatin  (LIPITOR) 10 MG tablet Take 10 mg by mouth daily.     Biotin 5000 MCG TABS Take 5,000 mcg by mouth daily.     CALCIUM  PO Take by mouth.     Cyanocobalamin (VITAMIN B-12 PO) Take 1 tablet by mouth daily.     diphenhydrAMINE  (BENADRYL ) 25 MG tablet Take 1 tablet (25 mg total) by mouth 3 (three) times daily. Take one tablet three times daily for two days 10 tablet 0   dolutegravir -lamiVUDine  (DOVATO ) 50-300 MG tablet Take 1 tablet by mouth daily. 30 tablet 0   hydrochlorothiazide  (MICROZIDE ) 12.5 MG capsule Take by mouth.     losartan  (COZAAR ) 100 MG tablet Take 100 mg by mouth daily.     metoprolol succinate (TOPROL-XL)  25 MG 24 hr tablet Take 12.5 mg by mouth daily.     Multiple Vitamin (MULTIVITAMIN) tablet Take 1 tablet by mouth daily.     nitroGLYCERIN (NITROSTAT) 0.4 MG SL tablet Place 0.4 mg under the tongue every 5 (five) minutes as needed for chest pain.     tiZANidine (ZANAFLEX) 4 MG tablet Take 1 tablet by mouth daily.     ursodiol  (ACTIGALL ) 250 MG tablet Take 250 mg by mouth 2 (two) times daily.     MAGNESIUM PO Take 125 mg by mouth 2 (two) times daily. (Patient not taking: Reported on 11/28/2023)     nitrofurantoin, macrocrystal-monohydrate, (MACROBID) 100 MG capsule Take 100 mg by mouth 2 (two) times daily. (Patient not taking: Reported on 11/28/2023)     polyethylene glycol (MIRALAX ) 17 g packet Take 17 g by mouth daily as needed. (Patient not taking: Reported on 11/28/2023) 14 each 0   No current facility-administered medications on file prior to visit.    Allergies  Allergen Reactions   Ramipril Swelling    Facial swelling   Aspirin Tinitus   Feldene [Piroxicam] Other (See Comments)    States causes eyelids to jump   Latex Itching   Nsaids Other (See Comments)    Ears ringing   Percocet [Oxycodone -Acetaminophen ]  Other (See Comments)    Hallucinations per patient   Tylenol  [Acetaminophen ] Itching    Pt states itching only occurred when taken with Oxycodone .  No reaction with tylenol  alone      Lab Results HIV 1 RNA Quant  Date Value  11/07/2023 79 copies/mL (H)  10/31/2022 Not Detected Copies/mL  12/13/2021 Not Detected Copies/mL   CD4 T Cell Abs (/uL)  Date Value  11/07/2023 912  10/31/2022 865  10/25/2021 733   No results found for: HIV1GENOSEQ Lab Results  Component Value Date   WBC 4.9 11/07/2023   HGB 12.2 11/07/2023   HCT 37.2 11/07/2023   MCV 105.4 (H) 11/07/2023   PLT 263 11/07/2023    Lab Results  Component Value Date   CREATININE 1.03 11/07/2023   BUN 20 11/07/2023   NA 141 11/07/2023   K 4.5 11/07/2023   CL 112 (H) 11/07/2023   CO2 22 11/07/2023    Lab Results  Component Value Date   ALT 11 11/07/2023   AST 19 11/07/2023   ALKPHOS 91 12/19/2020   BILITOT 0.4 11/07/2023    Lab Results  Component Value Date   CHOL 131 11/07/2023   TRIG 75 11/07/2023   HDL 69 11/07/2023   LDLCALC 46 11/07/2023   Lab Results  Component Value Date   HAV NON REACTIVE 12/07/2015   Lab Results  Component Value Date   HEPBSAG NEGATIVE 12/07/2015   HEPBSAB NEG 12/07/2015   Lab Results  Component Value Date   HCVAB NEGATIVE 12/07/2015   Lab Results  Component Value Date   CHLAMYDIAWP Negative 11/07/2023   N Negative 11/07/2023   No results found for: GCPROBEAPT Lab Results  Component Value Date   QUANTGOLD NEGATIVE 12/07/2015    Assessment/Plan #HIV -CD4 912, VL79, on 11/07/23 -Was not able to connect on video -refill xx 73month, please come to [facility] for appt.         #Vaccination COVID needs Flu 11/14/23 PCV/ 11/19/19 Meningitis aged out HepA one dose HEpB immunized Tdap 11/8/202  Shingles   #Health maintenance -Quantiferon -RPR nr 11/07/23 -HCV -Gcnegativev10/15/25 -Lipid on satin -Colonoscopy  Loney Stank, MD [facility]  I have personally spent 43 minutes involved in face-to-face and non-face-to-face activities for this patient on the day of the visit. Professional time spent includes the following activities: Preparing to see the patient (review of tests), Obtaining and/or reviewing separately obtained history (admission/discharge record), Performing a medically appropriate examination and/or evaluation , Ordering medications/tests/procedures, referring and communicating with other health care professionals, Documenting clinical information in the EMR, Independently interpreting results (not separately reported), Communicating results to the patient/family/caregiver, Counseling and educating the patient/family/caregiver and Care coordination (not  separately reported).

## 2023-12-06 ENCOUNTER — Other Ambulatory Visit: Payer: Self-pay

## 2023-12-07 ENCOUNTER — Other Ambulatory Visit: Payer: Self-pay

## 2023-12-07 NOTE — Progress Notes (Signed)
 Specialty Pharmacy Ongoing Clinical Assessment Note  Helen James is a 68 y.o. female who is being followed by the specialty pharmacy service for RxSp HIV   Patient's specialty medication(s) reviewed today: Dolutegravir -lamiVUDine  (Dovato )   Missed doses in the last 4 weeks: 0   Patient/Caregiver did not have any additional questions or concerns.   Therapeutic benefit summary: Patient is achieving benefit   Adverse events/side effects summary: No adverse events/side effects   Patient's therapy is appropriate to: Continue    Goals Addressed             This Visit's Progress    Achieve Undetectable HIV Viral Load < 20   Worsening    Patient is not on track and worsening. Patient will work on increased adherence. Viral load increased to 78 copies/mL as of labs on 11/07/23 although patient reports no missed doses.           Follow up: 12 months  [facility]

## 2023-12-07 NOTE — Progress Notes (Signed)
 Specialty Pharmacy Refill Coordination Note  Helen James is a 68 y.o. female contacted today regarding refills of specialty medication(s) Dolutegravir -lamiVUDine  (Dovato )   Patient requested Delivery   Delivery date: 12/12/23   Verified address: [address] [place]   Medication will be filled on: 12/11/23

## 2023-12-11 ENCOUNTER — Other Ambulatory Visit: Payer: Self-pay

## 2024-01-03 ENCOUNTER — Other Ambulatory Visit (HOSPITAL_COMMUNITY): Payer: Self-pay

## 2024-01-03 ENCOUNTER — Other Ambulatory Visit: Payer: Self-pay

## 2024-01-03 NOTE — Progress Notes (Signed)
 Specialty Pharmacy Refill Coordination Note  Helen James is a 68 y.o. female contacted today regarding refills of specialty medication(s) Dolutegravir -lamiVUDine  (Dovato )   Patient requested Delivery   Delivery date: 01/09/24   Verified address: [address] [place]   Medication will be filled on: 01/08/24

## 2024-01-08 ENCOUNTER — Other Ambulatory Visit: Payer: Self-pay

## 2024-01-18 ENCOUNTER — Other Ambulatory Visit: Payer: Self-pay | Admitting: Internal Medicine

## 2024-01-18 DIAGNOSIS — B009 Herpesviral infection, unspecified: Secondary | ICD-10-CM

## 2024-01-31 ENCOUNTER — Other Ambulatory Visit (HOSPITAL_COMMUNITY): Payer: Self-pay

## 2024-02-06 ENCOUNTER — Other Ambulatory Visit: Payer: Self-pay | Admitting: Pharmacy Technician

## 2024-02-06 ENCOUNTER — Other Ambulatory Visit: Payer: Self-pay

## 2024-02-06 NOTE — Progress Notes (Signed)
 Specialty Pharmacy Refill Coordination Note  Helen James is a 68 y.o. female contacted today regarding refills of specialty medication(s) Dolutegravir -lamiVUDine  (Dovato )   Patient requested Delivery   Delivery date: 02/12/24   Verified address: [address] [place]   Medication will be filled on: 02/11/24

## 2024-02-14 ENCOUNTER — Other Ambulatory Visit (HOSPITAL_COMMUNITY): Payer: Self-pay
